# Patient Record
Sex: Female | Born: 1965
Health system: Southern US, Community
[De-identification: ages and names within clinical notes are randomized; demographics above are authoritative.]

## PROBLEM LIST (undated history)

## (undated) DIAGNOSIS — R87619 Unspecified abnormal cytological findings in specimens from cervix uteri: Secondary | ICD-10-CM

## (undated) DIAGNOSIS — R609 Edema, unspecified: Secondary | ICD-10-CM

## (undated) DIAGNOSIS — A609 Anogenital herpesviral infection, unspecified: Secondary | ICD-10-CM

## (undated) DIAGNOSIS — N390 Urinary tract infection, site not specified: Secondary | ICD-10-CM

## (undated) DIAGNOSIS — M5030 Other cervical disc degeneration, unspecified cervical region: Secondary | ICD-10-CM

## (undated) DIAGNOSIS — M5020 Other cervical disc displacement, unspecified cervical region: Secondary | ICD-10-CM

## (undated) DIAGNOSIS — E039 Hypothyroidism, unspecified: Secondary | ICD-10-CM

## (undated) DIAGNOSIS — N83299 Other ovarian cyst, unspecified side: Secondary | ICD-10-CM

## (undated) HISTORY — DX: Other ovarian cyst, unspecified side: N83.299

## (undated) HISTORY — DX: Hypothyroidism, unspecified: E03.9

## (undated) HISTORY — PX: COLPOSCOPY: SHX161

## (undated) HISTORY — DX: Anogenital herpesviral infection, unspecified: A60.9

## (undated) HISTORY — DX: Other cervical disc degeneration, unspecified cervical region: M50.30

## (undated) HISTORY — DX: Unspecified abnormal cytological findings in specimens from cervix uteri: R87.619

## (undated) HISTORY — DX: Urinary tract infection, site not specified: N39.0

## (undated) HISTORY — DX: Edema, unspecified: R60.9

## (undated) HISTORY — DX: Other cervical disc displacement, unspecified cervical region: M50.20

---

## 1995-11-23 HISTORY — PX: AUGMENTATION MAMMAPLASTY: SUR837

## 1999-04-14 ENCOUNTER — Other Ambulatory Visit: Admission: RE | Admit: 1999-04-14 | Discharge: 1999-04-14 | Payer: Self-pay | Admitting: *Deleted

## 2000-06-13 ENCOUNTER — Other Ambulatory Visit: Admission: RE | Admit: 2000-06-13 | Discharge: 2000-06-13 | Payer: Self-pay | Admitting: *Deleted

## 2002-03-02 ENCOUNTER — Encounter: Admission: RE | Admit: 2002-03-02 | Discharge: 2002-03-02 | Payer: Self-pay | Admitting: *Deleted

## 2002-03-02 ENCOUNTER — Encounter: Payer: Self-pay | Admitting: *Deleted

## 2002-09-28 ENCOUNTER — Other Ambulatory Visit: Admission: RE | Admit: 2002-09-28 | Discharge: 2002-09-28 | Payer: Self-pay | Admitting: Obstetrics and Gynecology

## 2002-11-22 HISTORY — PX: COLPOSCOPY W/ BIOPSY / CURETTAGE: SUR283

## 2003-05-09 ENCOUNTER — Other Ambulatory Visit: Admission: RE | Admit: 2003-05-09 | Discharge: 2003-05-09 | Payer: Self-pay | Admitting: Obstetrics and Gynecology

## 2003-11-04 ENCOUNTER — Other Ambulatory Visit: Admission: RE | Admit: 2003-11-04 | Discharge: 2003-11-04 | Payer: Self-pay | Admitting: Obstetrics and Gynecology

## 2004-08-25 ENCOUNTER — Other Ambulatory Visit: Admission: RE | Admit: 2004-08-25 | Discharge: 2004-08-25 | Payer: Self-pay | Admitting: *Deleted

## 2005-08-23 ENCOUNTER — Ambulatory Visit (HOSPITAL_BASED_OUTPATIENT_CLINIC_OR_DEPARTMENT_OTHER): Admission: RE | Admit: 2005-08-23 | Discharge: 2005-08-23 | Payer: Self-pay | Admitting: Obstetrics and Gynecology

## 2005-08-23 HISTORY — PX: TUBAL LIGATION: SHX77

## 2006-02-17 ENCOUNTER — Encounter: Admission: RE | Admit: 2006-02-17 | Discharge: 2006-03-29 | Payer: Self-pay | Admitting: Neurology

## 2006-02-18 ENCOUNTER — Other Ambulatory Visit: Admission: RE | Admit: 2006-02-18 | Discharge: 2006-02-18 | Payer: Self-pay | Admitting: Obstetrics and Gynecology

## 2006-05-23 ENCOUNTER — Encounter: Admission: RE | Admit: 2006-05-23 | Discharge: 2006-05-23 | Payer: Self-pay | Admitting: Obstetrics and Gynecology

## 2007-03-20 ENCOUNTER — Other Ambulatory Visit: Admission: RE | Admit: 2007-03-20 | Discharge: 2007-03-20 | Payer: Self-pay | Admitting: Obstetrics and Gynecology

## 2008-05-08 ENCOUNTER — Encounter: Admission: RE | Admit: 2008-05-08 | Discharge: 2008-05-08 | Payer: Self-pay | Admitting: Obstetrics and Gynecology

## 2008-06-10 ENCOUNTER — Other Ambulatory Visit: Admission: RE | Admit: 2008-06-10 | Discharge: 2008-06-10 | Payer: Self-pay | Admitting: Obstetrics and Gynecology

## 2008-07-23 ENCOUNTER — Ambulatory Visit (HOSPITAL_BASED_OUTPATIENT_CLINIC_OR_DEPARTMENT_OTHER): Admission: RE | Admit: 2008-07-23 | Discharge: 2008-07-23 | Payer: Self-pay | Admitting: Urology

## 2008-10-27 ENCOUNTER — Emergency Department (HOSPITAL_COMMUNITY): Admission: EM | Admit: 2008-10-27 | Discharge: 2008-10-27 | Payer: Self-pay | Admitting: Family Medicine

## 2008-11-22 DIAGNOSIS — R609 Edema, unspecified: Secondary | ICD-10-CM

## 2008-11-22 HISTORY — DX: Edema, unspecified: R60.9

## 2008-12-04 ENCOUNTER — Ambulatory Visit (HOSPITAL_COMMUNITY): Admission: RE | Admit: 2008-12-04 | Discharge: 2008-12-04 | Payer: Self-pay | Admitting: Cardiology

## 2011-03-17 DIAGNOSIS — N83299 Other ovarian cyst, unspecified side: Secondary | ICD-10-CM

## 2011-03-17 HISTORY — DX: Other ovarian cyst, unspecified side: N83.299

## 2011-04-06 NOTE — Op Note (Signed)
NAME:  Jessica Hartman, ZUNKER NO.:  [id number]   MEDICAL RECORD NO.:  [id number]          PATIENT TYPE:  AMB   LOCATION:  [facility]                         FACILITY:  [facility]   PHYSICIAN:  Jamison Neighbor, M.D.  DATE OF BIRTH:  14-Jun-1966   DATE OF PROCEDURE:  07/23/2008  DATE OF DISCHARGE:                               OPERATIVE REPORT   PREOPERATIVE DIAGNOSIS:  Interstitial cystitis.   POSTOPERATIVE DIAGNOSIS:  Interstitial cystitis.   PROCEDURE:  Cystoscopy, urethral calibration, hydrodistention of the  bladder, Marcaine and Pyridium installation, Marcaine and Kenalog  injection.   SURGEON:  Dr. Logan Bores.   ANESTHESIA:  General.   COMPLICATIONS:  None.   DRAINS:  None.   BRIEF HISTORY:  This 45 year old female has had problems with pelvic  pain of undetermined etiology.  The patient describes it as being in the  bladder and has a sensation of her bladder cracking apart.  She also  has a sensation of her bladder feeling scraped raw.  The patient does  have adenomyosis, and there has been some question whether this enlarged  uterus may be causing problems with her bladder.  She is now to undergo  diagnostic cystoscopy.  The patient understands the risks and benefits  of the procedure.  She does note there were other options given to her  including potassium testing or simple empiric therapy.  The patient is  now to undergo cystoscopy and hydrodistention.  She understands the  risks and benefits of procedure and gave full informed consent.   PROCEDURE:  After successful induction of general anesthesia, the  patient was placed in the dorsal position, prepped with Betadine and  draped in the usual sterile fashion.  Careful bimanual examination  revealed unremarkable vagina.  The patient really did not have a  significant large uterus even though she does have a history of  adenomyosis.  NuvaRing was noted to be in place.  There was no  cystocele, rectocele or enterocele,  and there was no vault prolapse of  any kind.  Urethra was palpably normal.  No signs of a diverticulum.  Urethra was calibrated to 32-French with female urethral sounds with no  signs of significant stenosis or stricture.  The cystoscope was  inserted.  The bladder was carefully inspected.  No tumors or stones  could be seen.  Both ureteral orifices were normal in configuration and  location.  The uterus could be identified pushing down on the bladder  but was not significantly enlarged, and I do not believe that is the  source of the patient's problem.  The bladder was free of any  irregularity of the mucosa prior to hydrodistention.  Hydrodistention  was performed under a pressure of 100 cm of water for 5 minutes.  When  the bladder was drained, modest glomerulations could be seen.  Bladder  capacity of 750 mL was slightly diminished compared to a normal bladder  capacity of approximately 1150 but better the average IC capacity of  575.  This was certainly felt to be consistent with possible mild to  moderate interstitial cystitis.  The  bladder was drained.  A mixture of  Marcaine and Pyridium was left in the bladder.  Marcaine and Kenalog  were injected periurethrally.  The patient tolerated the procedure well  and was taken to the recovery room in good condition.  She will be sent  home on Lorcet Plus, Utira-C and doxycycline and return to see me in  follow-up at  which point we will assess the results of the hydrodistention.  Depending on how the patient feels, we will consider the initiation of  bladder directed therapy for her symptoms.  much      Jamison Neighbor, M.D.  Electronically Signed     RJE/MEDQ  D:  07/23/2008  T:  07/23/2008  Job:  [id number]

## 2011-04-09 NOTE — Op Note (Signed)
NAME:  Jessica Hartman, Jessica Hartman               ACCOUNT NO.:  [id number]   MEDICAL RECORD NO.:  [id number]          PATIENT TYPE:  AMB   LOCATION:  [facility]                         [facility]ACILITY:  [facility]   PHYSICIAN:  Cynthia P. Romine, M.D.DATE OF BIRTH:  [birth year]   DATE OF PROCEDURE:  08/23/2005  DATE OF DISCHARGE:                                 OPERATIVE REPORT   PREOPERATIVE DIAGNOSIS:  Desire for attempt at permanent surgical  sterilization.   POSTOPERATIVE DIAGNOSIS:  Desire for attempt at permanent surgical  sterilization.   OPERATION PERFORMED:  Laparoscopic Fallope ring bilateral tubal  sterilization procedure.   SURGEON:  Cynthia P. Romine, M.D.   ANESTHESIA:  General endotracheal.   ESTIMATED BLOOD LOSS:  Minimal.   COMPLICATIONS:  None.   DESCRIPTION OF PROCEDURE:  The patient was taken to the operating room and  after the induction of adequate general endotracheal, was placed in dorsal  lithotomy position and prepped and draped in the usual fashion.  The bladder  was drained with red rubber catheter.  A Hulka uterine manipulator was  placed.  Attention was turned next to the abdomen.  A small subumbilical  incision was made and a Veress needle was inserted into the peritoneal  space.  Proper placement of the Veress needle was tested by noting a  negative aspirate and free flow of saline through the Veress needle again  with a negative aspirin and then by noting the response of a drop of saline  placed at the hub of the Veress needle to negative pressure as the abdominal  wall was elevated.  Pneunmoperitoneum was created with 2.1 L of CO2 using  the automatic insufflator.  Next, the disposable 10-11 mm trocar was then  inserted into the peritoneal space and there was proper placement noted with  a laparoscope.  A small suprapubic incision was made and the 8 mm trocar was  inserted under direct visualization.  The Fallope ring applier was placed.  The anatomy was inspected.  The  uterus was mobile and of a normal size,  shape and contour.  The tubes and ovaries also appeared normal. In the  posterior cul-de-sac, between the uterosacral ligaments, there was a  peritoneal window consistent with possible endometriosis although no other  evidence of endometriosis was visualized.  The anterior cul-de-sac was  normal.  Photographic documentation was taken of the posterior cul-de-sac  window and of the tubes and ovaries, anterior cul-de-sac.  The right  fallopian tube was identified and traced to its fimbriated end.  The  midisthmic portion was elevated and a Fallope ring was placed.  A good  knuckle of tube was noted to be contained with the ring and good blanching  of the tube was noted after placement of the ring.  No bleeding was  encountered.  The procedure was repeated on the patient's left, identifying  the tube and tracing it to its fimbriated end.  The midisthmic portion was  elevated and the Fallope ring was placed.  A good knuckle of tube was noted  to be contained within the ring.  There was good blanching  of the tube noted  following placement of the ring and no bleeding was encountered.  Photographic documentation was taken of placement of each ring.  The  instruments were removed from the abdomen.  The laparoscope was removed. The  pneumoperitoneum was allowed to escape.  The sleeves were  removed and incisions were closed subcuticularly with 3-0 Vicryl.  The  instruments were removed from the vagina and the procedure was terminated.  The patient tolerated the procedure well, went in satisfactory condition to  post anesthesia recovery.           ______________________________  Edwena Felty. Romine, M.D.     CPR/MEDQ  D:  08/23/2005  T:  08/23/2005  Job:  [id number]

## 2011-05-28 HISTORY — PX: INTRAUTERINE DEVICE INSERTION: SHX323

## 2012-03-24 ENCOUNTER — Other Ambulatory Visit: Payer: Self-pay | Admitting: Obstetrics and Gynecology

## 2012-03-24 DIAGNOSIS — Z1231 Encounter for screening mammogram for malignant neoplasm of breast: Secondary | ICD-10-CM

## 2012-04-24 ENCOUNTER — Ambulatory Visit
Admission: RE | Admit: 2012-04-24 | Discharge: 2012-04-24 | Disposition: A | Discharge status: Home / Self Care | Payer: 59 | Source: Ambulatory Visit | Attending: Obstetrics and Gynecology | Admitting: Obstetrics and Gynecology

## 2012-04-24 DIAGNOSIS — Z1231 Encounter for screening mammogram for malignant neoplasm of breast: Secondary | ICD-10-CM

## 2013-03-28 ENCOUNTER — Ambulatory Visit: Payer: Self-pay | Admitting: Obstetrics and Gynecology

## 2013-05-18 ENCOUNTER — Encounter: Payer: Self-pay | Admitting: *Deleted

## 2013-05-31 ENCOUNTER — Encounter: Payer: Self-pay | Admitting: Nurse Practitioner

## 2013-05-31 ENCOUNTER — Ambulatory Visit (INDEPENDENT_AMBULATORY_CARE_PROVIDER_SITE_OTHER): Payer: 59 | Admitting: Nurse Practitioner

## 2013-05-31 VITALS — BP 112/62 | HR 64 | Resp 12 | Ht 62.5 in | Wt 126.0 lb

## 2013-05-31 DIAGNOSIS — K6289 Other specified diseases of anus and rectum: Secondary | ICD-10-CM

## 2013-05-31 DIAGNOSIS — Z0000 Encounter for general adult medical examination without abnormal findings: Secondary | ICD-10-CM

## 2013-05-31 DIAGNOSIS — Z01419 Encounter for gynecological examination (general) (routine) without abnormal findings: Secondary | ICD-10-CM

## 2013-05-31 DIAGNOSIS — G8929 Other chronic pain: Secondary | ICD-10-CM

## 2013-05-31 MED ORDER — VALACYCLOVIR HCL 1 G PO TABS: 1000.0000 mg | ORAL_TABLET | ORAL | Status: DC | PRN

## 2013-05-31 NOTE — Progress Notes (Signed)
47 y.o. G3P3 Married Caucasian Fe here for annual exam. Since Mirena IUD 7/12 menses is light.  She feels well except for having some rectal 'spasm or pain that seems to be worse at night.  No correlation to recent BM and no history of IBS.  She feels the pain is deep into rectum.  During her labor she is unsure if she had a laceration into the rectal area. She would like to have further evaluation.  Her daughter Turkey, is pt. here and just had a baby about 4 moths ago.  Patient's last menstrual period was 04/05/2013.          Sexually active: yes  The current method of family planning is tubal ligation.    Exercising: yes  running and weights  Smoker:  no  Health Maintenance: Pap:  02/26/2010  negative MMG:  04/24/2012 normal Colonoscopy:  never TDaP:  2008 Labs: PCP maintains labs and urine.    reports that she has never smoked. She has never used smokeless tobacco. She reports that she does not drink alcohol or use illicit drugs.  History reviewed. No pertinent past medical history.  Past Surgical History  Procedure Laterality Date  . Augmentation mammaplasty      breast implants    Current Outpatient Prescriptions  Medication Sig Dispense Refill  . acetaminophen (TYLENOL) 100 MG/ML solution Take 10 mg/kg by mouth every 4 (four) hours as needed for fever.      . Ascorbic Acid (VITAMIN C) 100 MG tablet Take 100 mg by mouth daily.      . fish oil-omega-3 fatty acids 1000 MG capsule Take 2 g by mouth daily.      . hydrochlorothiazide (MICROZIDE) 12.5 MG capsule Take 12.5 mg by mouth daily.      Marland Kitchen LORazepam (ATIVAN) 1 MG tablet Take 1 mg by mouth every 8 (eight) hours.      . Multiple Vitamin (MULTIVITAMIN) tablet Take 1 tablet by mouth daily.      . rizatriptan (MAXALT) 10 MG tablet Take 10 mg by mouth as needed for migraine. May repeat in 2 hours if needed      . valACYclovir (VALTREX) 1000 MG tablet Take 1,000 mg by mouth as needed.       No current facility-administered  medications for this visit.    Family History  Problem Relation Age of Onset  . Hypertension Mother   . Hypertension Father   . Cancer Father     melanoma  . Diabetes Father   . Cancer Maternal Grandmother     ovarian cancer    ROS:  Pertinent items are noted in HPI.  Otherwise, a comprehensive ROS was negative.  Exam:   BP 112/62  Pulse 64  Resp 12  Ht 5' 2.5" (1.588 m)  Wt 126 lb (57.153 kg)  BMI 22.66 kg/m2  LMP 04/05/2013 Height: 5' 2.5" (158.8 cm)  Ht Readings from Last 3 Encounters:  05/31/13 5' 2.5" (1.588 m)    General appearance: alert, cooperative and appears stated age Head: Normocephalic, without obvious abnormality, atraumatic Neck: no adenopathy, supple, symmetrical, trachea midline and thyroid normal to inspection and palpation Lungs: clear to auscultation bilaterally Breasts: normal appearance, no masses or tenderness Heart: regular rate and rhythm Abdomen: soft, non-tender; no masses,  no organomegaly Extremities: extremities normal, atraumatic, no cyanosis or edema Skin: Skin color, texture, turgor normal. No rashes or lesions Lymph nodes: Cervical, supraclavicular, and axillary nodes normal. No abnormal inguinal nodes palpated Neurologic: Grossly normal  Pelvic: External genitalia:  no lesions              Urethra:  normal appearing urethra with no masses, tenderness or lesions              Bartholin's and Skene's: normal                 Vagina: normal appearing vagina with normal color and discharge, no lesions              Cervix: anteverted IUD string is seen              Pap taken: yes including HR HPV Bimanual Exam:  Uterus:  normal size, contour, position, consistency, mobility, non-tender              Adnexa: no mass, fullness, tenderness               Rectovaginal: Confirms               Anus:  normal sphincter tone, no lesions, some pain but no mass  A:  Well Woman with normal exam  S/P BTL 10/06  History of menorrhagia and current  Mirena IUD in place 05/28/11  History of abnormal pap secondary to Paraguard IUD and maybe hx of HPV in  the remote past  Rectal pain of ? Etiology  History of Ov cyst bilaterally  P:   Pap smear as per guidelines   Call with pap results  Does need refill on Maxalt at this time - less HA's on Mirena IUD  Refill valtrex for 1 year  Mammogram due now and pt will schedule  Will get GI referral to Dr. Loreta Ave  counseled on breast self exam, adequate intake of calcium and vitamin D,   diet and exercise return annually or prn  An After Visit Summary was printed and given to the patient.

## 2013-05-31 NOTE — Patient Instructions (Signed)

## 2013-06-01 ENCOUNTER — Encounter: Payer: Self-pay | Admitting: Nurse Practitioner

## 2013-06-02 NOTE — Progress Notes (Signed)
Encounter reviewed by Dr. Mohanad Carsten Silva.  

## 2013-06-04 LAB — IPS PAP TEST WITH HPV

## 2013-06-08 ENCOUNTER — Telehealth: Payer: Self-pay | Admitting: Orthopedic Surgery

## 2013-06-08 NOTE — Telephone Encounter (Signed)
Pt notified of appt with Dr. Loreta Ave 06-19-13 at 1:45. Address and phone number given.

## 2014-06-03 ENCOUNTER — Ambulatory Visit: Payer: 59 | Admitting: Nurse Practitioner

## 2014-07-22 ENCOUNTER — Encounter: Payer: Self-pay | Admitting: Nurse Practitioner

## 2014-07-22 ENCOUNTER — Ambulatory Visit (INDEPENDENT_AMBULATORY_CARE_PROVIDER_SITE_OTHER): Payer: 59 | Admitting: Nurse Practitioner

## 2014-07-22 VITALS — BP 110/72 | HR 52 | Ht 62.5 in | Wt 127.0 lb

## 2014-07-22 DIAGNOSIS — N809 Endometriosis, unspecified: Secondary | ICD-10-CM

## 2014-07-22 DIAGNOSIS — Z01419 Encounter for gynecological examination (general) (routine) without abnormal findings: Secondary | ICD-10-CM

## 2014-07-22 DIAGNOSIS — N8000 Endometriosis of the uterus, unspecified: Secondary | ICD-10-CM

## 2014-07-22 DIAGNOSIS — Z975 Presence of (intrauterine) contraceptive device: Secondary | ICD-10-CM | POA: Insufficient documentation

## 2014-07-22 DIAGNOSIS — N8003 Adenomyosis of the uterus: Secondary | ICD-10-CM | POA: Insufficient documentation

## 2014-07-22 MED ORDER — VALACYCLOVIR HCL 1 G PO TABS: 1000.0000 mg | ORAL_TABLET | ORAL | Status: DC | PRN

## 2014-07-22 NOTE — Progress Notes (Signed)
Patient ID: Jessica Hartman, female   DOB: Aug 20, 1966, 48 y.o.   MRN: [id number] 48 y.o. G3P3 Married Caucasian Fe here for annual exam.  usually has spotting since IUD. No spotting for a year.  Saw Dr. Loreta Ave for rectal spasm and was treated with hycosamine without help.  Some vaso symptoms That re worse at night but tolerable.  New grand daughter is now 77 months old.  Patient's last menstrual period was 05/22/2013.          Sexually active: yes  The current method of family planning is tubal ligation and IUD. 05/28/11 Exercising: yes running and weights about 5 times per week Smoker: no   Health Maintenance:  Pap: 05/31/13, WNL, neg HR HPV MMG: 04/24/2012, Bi-Rads 1: negative   Colonoscopy: never  TDaP: 2008  Labs: PCP maintains labs and urine.     reports that she has never smoked. She has never used smokeless tobacco. She reports that she does not drink alcohol or use illicit drugs.  Past Medical History  Diagnosis Date  . Idiopathic edema 2010    using prn meds  . UTI (lower urinary tract infection)     post coital  . Ovarian cyst, complex 03/17/11    right 26 x 21 x 25 mm, also uterine fibroid  . HSV (herpes simplex virus) anogenital infection     Past Surgical History  Procedure Laterality Date  . Tubal ligation  08/23/05    follopian tube ring  . Augmentation mammaplasty  1997    breast implants  . Colposcopy w/ biopsy / curettage  2004    secondary to paraguqrd IUD  . Intrauterine device insertion  05/28/11    Mirena IUD    Current Outpatient Prescriptions  Medication Sig Dispense Refill  . acetaminophen (TYLENOL) 100 MG/ML solution Take 10 mg/kg by mouth every 4 (four) hours as needed for fever.      . Ascorbic Acid (VITAMIN C) 100 MG tablet Take 100 mg by mouth daily.      . fish oil-omega-3 fatty acids 1000 MG capsule Take 2 g by mouth daily.      . hydrochlorothiazide (MICROZIDE) 12.5 MG capsule Take 12.5 mg by mouth daily.      Marland Kitchen levonorgestrel (MIRENA) 20 MCG/24HR IUD  1 each by Intrauterine route once.      Marland Kitchen LORazepam (ATIVAN) 1 MG tablet Take 1 mg by mouth every 8 (eight) hours.      . Multiple Vitamin (MULTIVITAMIN) tablet Take 1 tablet by mouth daily.      . nitrofurantoin, macrocrystal-monohydrate, (MACROBID) 100 MG capsule Take 100 mg by mouth as needed.      . rizatriptan (MAXALT) 10 MG tablet Take 10 mg by mouth as needed for migraine. May repeat in 2 hours if needed      . valACYclovir (VALTREX) 1000 MG tablet Take 1 tablet (1,000 mg total) by mouth as needed.  90 tablet  3   No current facility-administered medications for this visit.    Family History  Problem Relation Age of Onset  . Hypertension Father   . Cancer Father 74    melanoma  . Diabetes Father   . Hyperlipidemia Father   . Ovarian cancer Maternal Grandmother     ovarian cancer  . Stroke Paternal Grandmother     ROS:  Pertinent items are noted in HPI.  Otherwise, a comprehensive ROS was negative.  Exam:   BP 110/72  Pulse 52  Ht 5' 2.5" (1.588 m)  Wt 127 lb (57.607 kg)  BMI 22.84 kg/m2  LMP 05/22/2013 Height: 5' 2.5" (158.8 cm)  Ht Readings from Last 3 Encounters:  07/22/14 5' 2.5" (1.588 m)  05/31/13 5' 2.5" (1.588 m)    General appearance: alert, cooperative and appears stated age Head: Normocephalic, without obvious abnormality, atraumatic Neck: no adenopathy, supple, symmetrical, trachea midline and thyroid normal to inspection and palpation Lungs: clear to auscultation bilaterally Breasts: normal appearance, no masses or tenderness Heart: regular rate and rhythm Abdomen: soft, non-tender; no masses,  no organomegaly Extremities: extremities normal, atraumatic, no cyanosis or edema Skin: Skin color, texture, turgor normal. No rashes or lesions Lymph nodes: Cervical, supraclavicular, and axillary nodes normal. No abnormal inguinal nodes palpated Neurologic: Grossly normal   Pelvic: External genitalia:  no lesions              Urethra:  normal appearing  urethra with no masses, tenderness or lesions              Bartholin's and Skene's: normal                 Vagina: normal appearing vagina with normal color and discharge, no lesions              Cervix: anteverted              Pap taken: No. Bimanual Exam:  Uterus:  normal size, contour, position, consistency, mobility, non-tender              Adnexa: no mass, fullness, tenderness               Rectovaginal: Confirms               Anus:  normal sphincter tone, no lesions  A:  Well Woman with normal exam  IUD - Mirena 05/2011  History of rectal spasm  History of Adenomyosis  P:   Reviewed health and wellness pertinent to exam  Pap smear not taken today  Mammogram is due and she will schedule  Valtrex does not now and will refill for when she needs.  Counseled on breast self exam, mammography screening, adequate intake of calcium and vitamin D, diet and exercise, Kegel's exercises return annually or prn  An After Visit Summary was printed and given to the patient.

## 2014-07-22 NOTE — Progress Notes (Signed)
Encounter reviewed by Dr. Brook Silva.  

## 2014-07-22 NOTE — Patient Instructions (Signed)

## 2014-09-23 ENCOUNTER — Encounter: Payer: Self-pay | Admitting: Nurse Practitioner

## 2015-06-12 ENCOUNTER — Other Ambulatory Visit: Payer: Self-pay

## 2015-06-12 DIAGNOSIS — Z1231 Encounter for screening mammogram for malignant neoplasm of breast: Secondary | ICD-10-CM

## 2015-07-29 ENCOUNTER — Encounter: Payer: Self-pay | Admitting: Nurse Practitioner

## 2015-07-29 ENCOUNTER — Ambulatory Visit (INDEPENDENT_AMBULATORY_CARE_PROVIDER_SITE_OTHER): Payer: 59 | Admitting: Nurse Practitioner

## 2015-07-29 VITALS — BP 100/66 | HR 52 | Ht 62.0 in | Wt 126.0 lb

## 2015-07-29 DIAGNOSIS — Z01419 Encounter for gynecological examination (general) (routine) without abnormal findings: Secondary | ICD-10-CM

## 2015-07-29 MED ORDER — NITROFURANTOIN MONOHYD MACRO 100 MG PO CAPS: 100.0000 mg | ORAL_CAPSULE | ORAL | Status: DC | PRN

## 2015-07-29 MED ORDER — VALACYCLOVIR HCL 1 G PO TABS: 1000.0000 mg | ORAL_TABLET | ORAL | Status: DC | PRN

## 2015-07-29 MED ORDER — AZITHROMYCIN 250 MG PO TABS: 250.0000 mg | ORAL_TABLET | Freq: Every day | ORAL | Status: DC

## 2015-07-29 NOTE — Patient Instructions (Signed)

## 2015-07-29 NOTE — Progress Notes (Signed)
Patient ID: Jessica Hartman, female   DOB: 15-Nov-1966, 49 y.o.   MRN: [id number] 49 y.o. G34P3003 Married  Caucasian Fe here for annual exam.  Since Mirena IUD placement 05/2011 she has had usually only light spotting and this has been only on rare occasions. Then in May, menses was moderate for a week like her cycles before IUD placement .  Previous spotting before this was 05/2013.  She is having some left maxillary pain from sinusitis with thick nasal discharge.  She ask for antibiotic - to see PCP on 9/15 but not does want to postpone treatment.  Patient's last menstrual period was 04/21/2015 (exact date).          Sexually active: Yes.    The current method of family planning is tubal ligation IUD.  Mirena place 05/28/11.    Exercising: Yes.    weights and running 3-5 miles 4-5 times per week Smoker:  no  Health Maintenance: Pap: 05/31/13, Negative with neg HR HPV MMG: 04/24/2012, Bi-Rads 1: Negative, has apt 08/11/15  Colonoscopy: Never   TDaP: 2008  Labs:  Dr. Timothy Lasso 06/2015   reports that she has never smoked. She has never used smokeless tobacco. She reports that she does not drink alcohol or use illicit drugs.  Past Medical History  Diagnosis Date  . Idiopathic edema 2010    using prn meds  . UTI (lower urinary tract infection)     post coital  . Ovarian cyst, complex 03/17/11    right 26 x 21 x 25 mm, also uterine fibroid  . HSV (herpes simplex virus) anogenital infection     Past Surgical History  Procedure Laterality Date  . Tubal ligation  08/23/05    follopian tube ring  . Augmentation mammaplasty  1997    breast implants  . Colposcopy w/ biopsy / curettage  2004    secondary to paraguqrd IUD  . Intrauterine device insertion  05/28/11    Mirena IUD    Current Outpatient Prescriptions  Medication Sig Dispense Refill  . acetaminophen (TYLENOL) 100 MG/ML solution Take 10 mg/kg by mouth every 4 (four) hours as needed for fever.    . Ascorbic Acid (VITAMIN C) 100 MG tablet Take  100 mg by mouth daily.    . fish oil-omega-3 fatty acids 1000 MG capsule Take 2 g by mouth daily.    . hydrochlorothiazide (MICROZIDE) 12.5 MG capsule Take 12.5 mg by mouth daily.    Marland Kitchen levonorgestrel (MIRENA) 20 MCG/24HR IUD 1 each by Intrauterine route once.    Marland Kitchen LORazepam (ATIVAN) 1 MG tablet Take 1 mg by mouth every 8 (eight) hours.    . montelukast (SINGULAIR) 10 MG tablet Take 10 mg by mouth at bedtime.    . Multiple Vitamin (MULTIVITAMIN) tablet Take 1 tablet by mouth daily.    . nitrofurantoin, macrocrystal-monohydrate, (MACROBID) 100 MG capsule Take 1 capsule (100 mg total) by mouth as needed. 90 capsule 3  . rizatriptan (MAXALT) 10 MG tablet Take 10 mg by mouth as needed for migraine. May repeat in 2 hours if needed    . valACYclovir (VALTREX) 1000 MG tablet Take 1 tablet (1,000 mg total) by mouth as needed. 90 tablet 3  . azithromycin (ZITHROMAX) 250 MG tablet Take 1 tablet (250 mg total) by mouth daily. 6 tablet 0   No current facility-administered medications for this visit.    Family History  Problem Relation Age of Onset  . Hypertension Father   . Cancer Father 37  melanoma  . Diabetes Father   . Hyperlipidemia Father   . Ovarian cancer Maternal Grandmother     ovarian cancer  . Stroke Paternal Grandmother     ROS:  Pertinent items are noted in HPI.  Otherwise, a comprehensive ROS was negative.  Exam:   BP 100/66 mmHg  Pulse 52  Ht  (1.575 m)  Wt 126 lb (57.153 kg)  BMI 23.04 kg/m2  LMP 04/21/2015 (Exact Date) Height:  (157.5 cm) Ht Readings from Last 3 Encounters:  07/29/15  (1.575 m)  07/22/14 5' 2.5" (1.588 m)  05/31/13 5' 2.5" (1.588 m)    General appearance: alert, cooperative and appears stated age Head: Normocephalic, without obvious abnormality, atraumatic Neck: no adenopathy, supple, symmetrical, trachea midline and thyroid normal to inspection and palpation Lungs: clear to auscultation bilaterally Breasts: positive findings:  implants bilaterally, with adhesions and they are very firm. Heart: regular rate and rhythm Abdomen: soft, non-tender; no masses,  no organomegaly Extremities: extremities normal, atraumatic, no cyanosis or edema Skin: Skin color, texture, turgor normal. No rashes or lesions Lymph nodes: Cervical, supraclavicular, and axillary nodes normal. No abnormal inguinal nodes palpated Neurologic: Grossly normal   Pelvic: External genitalia:  no lesions              Urethra:  normal appearing urethra with no masses, tenderness or lesions              Bartholin's and Skene's: normal                 Vagina: normal appearing vagina with normal color and discharge, no lesions              Cervix: anteverted, IUD strings are visible              Pap taken: No. Bimanual Exam:  Uterus:  normal size, contour, position, consistency, mobility, non-tender              Adnexa: no mass, fullness, tenderness               Rectovaginal: Confirms               Anus:  normal sphincter tone, no lesions  Chaperone present: no  A:  Well Woman with normal exam  IUD - Mirena 05/2011 History of rectal spasm History of Adenomyosis  S/P Breast augmentation 1997  Maxillary sinusitis   P:   Reviewed health and wellness pertinent to exam  Pap smear as above  Mammogram is due and scheduled for 07/2015  Refill on Valtrex and Macrobid to use prn  Rx. Zithromax Z pack # 6 take as directed - if no better to see PCP - her apt. Is already scheduled for 08/07/15  Counseled on breast self exam, mammography screening, adequate intake of calcium and vitamin D, diet and exercise, Kegel's exercises return annually or prn  An After Visit Summary was printed and given to the patient.

## 2015-07-31 NOTE — Progress Notes (Signed)
Encounter reviewed by Dr. Brook Amundson C. Silva.  

## 2015-08-11 ENCOUNTER — Ambulatory Visit
Admission: RE | Admit: 2015-08-11 | Discharge: 2015-08-11 | Disposition: A | Discharge status: Home / Self Care | Payer: 59 | Source: Ambulatory Visit

## 2015-08-11 DIAGNOSIS — Z1231 Encounter for screening mammogram for malignant neoplasm of breast: Secondary | ICD-10-CM

## 2016-01-09 DIAGNOSIS — H5203 Hypermetropia, bilateral: Secondary | ICD-10-CM | POA: Diagnosis not present

## 2016-01-09 DIAGNOSIS — H52222 Regular astigmatism, left eye: Secondary | ICD-10-CM | POA: Diagnosis not present

## 2016-01-09 DIAGNOSIS — H524 Presbyopia: Secondary | ICD-10-CM | POA: Diagnosis not present

## 2016-01-21 DIAGNOSIS — L570 Actinic keratosis: Secondary | ICD-10-CM | POA: Diagnosis not present

## 2016-01-21 DIAGNOSIS — L739 Follicular disorder, unspecified: Secondary | ICD-10-CM | POA: Diagnosis not present

## 2016-02-25 DIAGNOSIS — L309 Dermatitis, unspecified: Secondary | ICD-10-CM | POA: Diagnosis not present

## 2016-07-07 MED FILL — LORazepam 0.5 MG TABS: 0.5 | 30 days supply | Qty: 30 | Fill #0

## 2016-07-07 MED FILL — valACYclovir HCL 1 GM TABS: 1 | 90 days supply | Qty: 90 | Fill #0

## 2016-07-07 MED FILL — NITROFURANTOIN MONO-MCR 100: 100 | 90 days supply | Qty: 90 | Fill #1

## 2016-07-07 MED FILL — HYDROCHLOROTHIAZIDE 12.5 MG: 12.5 | 30 days supply | Qty: 30 | Fill #0

## 2016-07-12 MED FILL — TRETINOIN 0.025% CREAM: 0.025 | 90 days supply | Qty: 45 | Fill #0

## 2016-08-02 ENCOUNTER — Ambulatory Visit (INDEPENDENT_AMBULATORY_CARE_PROVIDER_SITE_OTHER): Payer: 59 | Admitting: Nurse Practitioner

## 2016-08-02 ENCOUNTER — Encounter: Payer: Self-pay | Admitting: Nurse Practitioner

## 2016-08-02 VITALS — BP 102/56 | HR 56 | Ht 62.0 in | Wt 128.0 lb

## 2016-08-02 DIAGNOSIS — Z23 Encounter for immunization: Secondary | ICD-10-CM

## 2016-08-02 DIAGNOSIS — Z124 Encounter for screening for malignant neoplasm of cervix: Secondary | ICD-10-CM | POA: Diagnosis not present

## 2016-08-02 DIAGNOSIS — Z0000 Encounter for general adult medical examination without abnormal findings: Secondary | ICD-10-CM | POA: Diagnosis not present

## 2016-08-02 DIAGNOSIS — Z01419 Encounter for gynecological examination (general) (routine) without abnormal findings: Secondary | ICD-10-CM | POA: Diagnosis not present

## 2016-08-02 DIAGNOSIS — Z1151 Encounter for screening for human papillomavirus (HPV): Secondary | ICD-10-CM | POA: Diagnosis not present

## 2016-08-02 DIAGNOSIS — N951 Menopausal and female climacteric states: Secondary | ICD-10-CM | POA: Diagnosis not present

## 2016-08-02 LAB — HIV ANTIBODY (ROUTINE TESTING W REFLEX): HIV 1&2 Ab, 4th Generation: NONREACTIVE

## 2016-08-02 NOTE — Patient Instructions (Signed)

## 2016-08-02 NOTE — Progress Notes (Signed)
Patient ID: Jessica Hartman, female   DOB: 01/16/1966, 50 y.o.   MRN: [id number]  50 y.o. [id number] Married  Caucasian Fe here for annual exam.  Last spotting 03/2015 for a few days.  Now has some vaginal discharge over a couple months.  IUD needs replacement or removal.  Will get [facility] and follow.  Had a pt incident and got stuck with a needle last fall.  Needs follow up HIV  & Hep C.  Patient's last menstrual period was 04/21/2015 (exact date).          Sexually active: Yes.    The current method of family planning is tubal ligation and IUD. Mirena placed 05/28/11. Exercising: Yes.  walking, running and weights at least three times per week  Smoker:  no  Health Maintenance: Pap: 05/31/13, Negative with neg HR HPV MMG: 08/11/15, 3D, Bi-Rads 1: Negative  Colonoscopy: Spring 2017, normal, repeat in 10 years, Dr. Elnoria Howard TDaP: 2008 HIV: 1998 with pregnancy Labs: Dr. Timothy Lasso takes care of all labs   reports that she has never smoked. She has never used smokeless tobacco. She reports that she does not drink alcohol or use drugs.  Past Medical History:  Diagnosis Date  . HSV (herpes simplex virus) anogenital infection   . Idiopathic edema 2010   using prn meds  . Ovarian cyst, complex 03/17/11   right 26 x 21 x 25 mm, also uterine fibroid  . UTI (lower urinary tract infection)    post coital    Past Surgical History:  Procedure Laterality Date  . AUGMENTATION MAMMAPLASTY  1997   breast implants  . COLPOSCOPY W/ BIOPSY / CURETTAGE  2004   secondary to paraguqrd IUD  . INTRAUTERINE DEVICE INSERTION  05/28/11   Mirena IUD  . TUBAL LIGATION  08/23/05   follopian tube ring    Current Outpatient Prescriptions  Medication Sig Dispense Refill  . acetaminophen (TYLENOL) 100 MG/ML solution Take 10 mg/kg by mouth every 4 (four) hours as needed for fever.    . Ascorbic Acid (VITAMIN C) 100 MG tablet Take 100 mg by mouth daily.    . fish oil-omega-3 fatty acids 1000 MG capsule Take 2 g by mouth daily.    .  hydrochlorothiazide (MICROZIDE) 12.5 MG capsule Take 12.5 mg by mouth daily.    Marland Kitchen levonorgestrel (MIRENA) 20 MCG/24HR IUD 1 each by Intrauterine route once.    Marland Kitchen LORazepam (ATIVAN) 1 MG tablet Take 1 mg by mouth every 8 (eight) hours.    . montelukast (SINGULAIR) 10 MG tablet Take 10 mg by mouth at bedtime.    . Multiple Vitamin (MULTIVITAMIN) tablet Take 1 tablet by mouth daily.    . nitrofurantoin, macrocrystal-monohydrate, (MACROBID) 100 MG capsule Take 1 capsule (100 mg total) by mouth as needed. 90 capsule 3  . rizatriptan (MAXALT) 10 MG tablet Take 10 mg by mouth as needed for migraine. May repeat in 2 hours if needed    . valACYclovir (VALTREX) 1000 MG tablet Take 1 tablet (1,000 mg total) by mouth as needed. 90 tablet 3   No current facility-administered medications for this visit.     Family History  Problem Relation Age of Onset  . Hypertension Father   . Cancer Father 23    melanoma  . Diabetes Father   . Hyperlipidemia Father   . Ovarian cancer Maternal Grandmother     ovarian cancer  . Stroke Paternal Grandmother     ROS:  Pertinent items are noted in HPI.  Otherwise, a comprehensive ROS was negative.  Exam:   BP (!) 102/56 (BP Location: Right Arm, Patient Position: Sitting, Cuff Size: Normal)   Pulse (!) 56   Ht 5\' 2"  (1.575 m)   Wt 128 lb (58.1 kg)   LMP 04/21/2015 (Exact Date) Comment: spotting only, none since  BMI 23.41 kg/m  Height: 5\' 2"  (157.5 cm) Ht Readings from Last 3 Encounters:  08/02/16 5\' 2"  (1.575 m)  07/29/15 5\' 2"  (1.575 m)  07/22/14 5' 2.5" (1.588 m)    General appearance: alert, cooperative and appears stated age Head: Normocephalic, without obvious abnormality, atraumatic Neck: no adenopathy, supple, symmetrical, trachea midline and thyroid normal to inspection and palpation Lungs: clear to auscultation bilaterally Breasts: normal appearance, no masses or tenderness, positive findings: implants bilaterally that are very firm Heart:  regular rate and rhythm Abdomen: soft, non-tender; no masses,  no organomegaly Extremities: extremities normal, atraumatic, no cyanosis or edema Skin: Skin color, texture, turgor normal. No rashes or lesions Lymph nodes: Cervical, supraclavicular, and axillary nodes normal. No abnormal inguinal nodes palpated Neurologic: Grossly normal   Pelvic: External genitalia:  no lesions              Urethra:  normal appearing urethra with no masses, tenderness or lesions              Bartholin's and Skene's: normal                 Vagina: normal appearing vagina with normal color and discharge, no lesions              Cervix: anteverted IUD strings are visible              Pap taken: Yes.   Bimanual Exam:  Uterus:  normal size, contour, position, consistency, mobility, non-tender              Adnexa: no mass, fullness, tenderness               Rectovaginal: Confirms               Anus:  normal sphincter tone, no lesions  Chaperone present: yes  A:  Well Woman with normal exam   Mirena 05/28/2011 History of rectal spasm - saw Dr. Elnoria HowardHung History of Adenomyosis              S/P Breast augmentation 1997              History of Idiopathic edema  Remote history of abnormal pap 2004 secondary to Paraguard IUD                      P:   Reviewed health and wellness pertinent to exam  Pap smear as above  Mammogram is due this month  Follow with labs  [facility] if [facility] or if another needs insertion.  Counseled on breast self exam, mammography screening, adequate intake of calcium and vitamin D, diet and exercise, Kegel's exercises return annually or prn  An After Visit Summary was printed and given to the patient.

## 2016-08-03 ENCOUNTER — Other Ambulatory Visit: Payer: Self-pay | Admitting: Nurse Practitioner

## 2016-08-03 DIAGNOSIS — Z975 Presence of (intrauterine) contraceptive device: Secondary | ICD-10-CM

## 2016-08-03 LAB — HEPATITIS C ANTIBODY: HCV Ab: NEGATIVE

## 2016-08-03 LAB — FOLLICLE STIMULATING HORMONE: FSH: 53.6 m[IU]/mL

## 2016-08-04 ENCOUNTER — Other Ambulatory Visit: Payer: Self-pay | Admitting: *Deleted

## 2016-08-04 DIAGNOSIS — Z309 Encounter for contraceptive management, unspecified: Secondary | ICD-10-CM

## 2016-08-04 LAB — IPS PAP TEST WITH HPV

## 2016-08-07 NOTE — Progress Notes (Signed)
Encounter reviewed by Dr. Brook Amundson C. Silva.  

## 2016-09-06 ENCOUNTER — Ambulatory Visit (INDEPENDENT_AMBULATORY_CARE_PROVIDER_SITE_OTHER): Payer: 59 | Admitting: Obstetrics and Gynecology

## 2016-09-06 ENCOUNTER — Encounter: Payer: Self-pay | Admitting: Obstetrics and Gynecology

## 2016-09-06 VITALS — BP 118/78 | HR 58 | Resp 14 | Wt 125.0 lb

## 2016-09-06 DIAGNOSIS — Z30432 Encounter for removal of intrauterine contraceptive device: Secondary | ICD-10-CM | POA: Diagnosis not present

## 2016-09-06 DIAGNOSIS — Z308 Encounter for other contraceptive management: Secondary | ICD-10-CM

## 2016-09-06 NOTE — Progress Notes (Signed)
GYNECOLOGY  VISIT   HPI: 50 y.o.   Married  Caucasian  female   [phone] with No LMP recorded. Patient is not currently having periods (Reason: IUD).   here for IUD removal. Mirena IUD placed on 05/28/11.  FSH 53.6 on 08/02/16. Hot flashes resolved.  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   GYNECOLOGIC HISTORY: No LMP recorded. Patient is not currently having periods (Reason: IUD). Contraception:  IUD (Mirena) Menopausal hormone therapy:  None  Last mammogram:  08-13-15 WNL BI RADS 1 Last pap smear:   08-02-16 WNL NEG HR HPV         OB History    Gravida Para Term Preterm AB Living   3 3 1 2  0 3   SAB TAB Ectopic Multiple Live Births   0 0 0 0 3         Patient Active Problem List   Diagnosis Date Noted  . Adenomyosis 07/22/2014  . IUD (intrauterine device) in place 07/22/2014    Past Medical History:  Diagnosis Date  . HSV (herpes simplex virus) anogenital infection   . Idiopathic edema 2010   using prn meds  . Ovarian cyst, complex 03/17/11   right 26 x 21 x 25 mm, also uterine fibroid  . UTI (lower urinary tract infection)    post coital    Past Surgical History:  Procedure Laterality Date  . AUGMENTATION MAMMAPLASTY  1997   breast implants  . COLPOSCOPY W/ BIOPSY / CURETTAGE  2004   secondary to paraguqrd IUD  . INTRAUTERINE DEVICE INSERTION  05/28/11   Mirena IUD  . TUBAL LIGATION  08/23/05   follopian tube ring    Current Outpatient Prescriptions  Medication Sig Dispense Refill  . acetaminophen (TYLENOL) 100 MG/ML solution Take 10 mg/kg by mouth every 4 (four) hours as needed for fever.    . Ascorbic Acid (VITAMIN C) 100 MG tablet Take 100 mg by mouth daily.    . fish oil-omega-3 fatty acids 1000 MG capsule Take 2 g by mouth daily.    . hydrochlorothiazide (MICROZIDE) 12.5 MG capsule Take 12.5 mg by mouth daily.    Marland Kitchen. levonorgestrel (MIRENA) 20 MCG/24HR IUD 1 each by Intrauterine route once.    Marland Kitchen. LORazepam (ATIVAN) 1 MG tablet Take 1 mg by mouth every 8  (eight) hours.    . montelukast (SINGULAIR) 10 MG tablet Take 10 mg by mouth at bedtime.    . Multiple Vitamin (MULTIVITAMIN) tablet Take 1 tablet by mouth daily.    . nitrofurantoin, macrocrystal-monohydrate, (MACROBID) 100 MG capsule Take 1 capsule (100 mg total) by mouth as needed. 90 capsule 3  . rizatriptan (MAXALT) 10 MG tablet Take 10 mg by mouth as needed for migraine. May repeat in 2 hours if needed    . valACYclovir (VALTREX) 1000 MG tablet Take 1 tablet (1,000 mg total) by mouth as needed. 90 tablet 3   No current facility-administered medications for this visit.      ALLERGIES: Clindamycin/lincomycin and Lidocaine  Family History  Problem Relation Age of Onset  . Hypertension Father   . Cancer Father [id number]    melanoma  . Diabetes Father   . Hyperlipidemia Father   . Heart disease Father   . Dementia Father   . Ovarian cancer Maternal Grandmother 35    OV cancer  . Stroke Paternal Grandmother     Social History   Social History  . Marital status: Married    Spouse name: N/A  .  Number of children: N/A  . Years of education: N/A   Occupational History  . Not on file.   Social History Main Topics  . Smoking status: Never Smoker  . Smokeless tobacco: Never Used  . Alcohol use No  . Drug use: No  . Sexual activity: Yes    Birth control/ protection: Surgical     Comment: BTL   Other Topics Concern  . Not on file   Social History Narrative  . No narrative on file    ROS:  Pertinent items are noted in HPI.  PHYSICAL EXAMINATION:    BP 118/78 (BP Location: Right Arm, Patient Position: Sitting, Cuff Size: Normal)   Pulse (!) 58   Resp 14   Wt 125 lb (56.7 kg)   BMI 22.86 kg/m     General appearance: alert, cooperative and appears stated age.  Looks tired.    Pelvic: External genitalia:  no lesions              Urethra:  normal appearing urethra with no masses, tenderness or lesions              Bartholins and Skenes: normal                 Vagina:  normal appearing vagina with normal color and discharge, no lesions              Cervix: no lesions.  IUD strings noted.  IUD removed with ring forceps after verbal consent.  IUD intact, shown to patient, and discarded.                Bimanual Exam:  Uterus:  normal size, contour, position, consistency, mobility, non-tender              Adnexa: no mass, fullness, tenderness           Chaperone was present for exam.  ASSESSMENT  Mirena IUD removal.  Menopausal by [facility].   PLAN  Call for any future bleeding.  Call for hot flashes, night sweats, and vaginal dryness. Follow up for annual exam and prn.    An After Visit Summary was printed and given to the patient.  ___15___ minutes face to face time of which over 50% was spent in counseling.

## 2017-06-07 ENCOUNTER — Telehealth: Payer: Self-pay | Admitting: Certified Nurse Midwife

## 2017-06-07 NOTE — Telephone Encounter (Signed)
Left message for patient to reschedule with another provider

## 2017-07-30 ENCOUNTER — Emergency Department (HOSPITAL_COMMUNITY)
Admission: EM | Admit: 2017-07-30 | Discharge: 2017-07-30 | Disposition: A | Discharge status: Home / Self Care | Payer: PRIVATE HEALTH INSURANCE | Attending: Emergency Medicine | Admitting: Emergency Medicine

## 2017-07-30 ENCOUNTER — Encounter (HOSPITAL_COMMUNITY): Payer: Self-pay | Admitting: *Deleted

## 2017-07-30 ENCOUNTER — Emergency Department (HOSPITAL_COMMUNITY): Payer: PRIVATE HEALTH INSURANCE

## 2017-07-30 DIAGNOSIS — S6992XA Unspecified injury of left wrist, hand and finger(s), initial encounter: Secondary | ICD-10-CM | POA: Insufficient documentation

## 2017-07-30 DIAGNOSIS — Y939 Activity, unspecified: Secondary | ICD-10-CM | POA: Diagnosis not present

## 2017-07-30 DIAGNOSIS — Y929 Unspecified place or not applicable: Secondary | ICD-10-CM | POA: Diagnosis not present

## 2017-07-30 DIAGNOSIS — Y998 Other external cause status: Secondary | ICD-10-CM | POA: Insufficient documentation

## 2017-07-30 DIAGNOSIS — W230XXA Caught, crushed, jammed, or pinched between moving objects, initial encounter: Secondary | ICD-10-CM | POA: Diagnosis not present

## 2017-07-30 DIAGNOSIS — M79642 Pain in left hand: Secondary | ICD-10-CM | POA: Diagnosis present

## 2017-07-30 MED ORDER — ACETAMINOPHEN 325 MG PO TABS
650.0000 mg | ORAL_TABLET | Freq: Once | ORAL | Status: AC
  Administered 2017-07-30: 650 mg via ORAL
  Filled 2017-07-30: qty 2

## 2017-07-30 NOTE — ED Provider Notes (Signed)
[facility]-EMERGENCY DEPT Provider Note   CSN: [id number] Arrival date & time: 07/30/17  1850     History   Chief Complaint Chief Complaint  Patient presents with  . Hand Problem    HPI Jessica Hartman is a 51 y.o. female with no significant past medical history presenting with left hand pain after getting her hand stuck between 2 hospital beds. 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 She is reporting left thumb pain exacerbated by movement or palpation. No swelling or loss of sensation. Patient has not tried anything for pain prior to arrival.  HPI  Past Medical History:  Diagnosis Date  . HSV (herpes simplex virus) anogenital infection   . Idiopathic edema 2010   using prn meds  . Ovarian cyst, complex 03/17/11   right 26 x 21 x 25 mm, also uterine fibroid  . UTI (lower urinary tract infection)    post coital    Patient Active Problem List   Diagnosis Date Noted  . Adenomyosis 07/22/2014  . IUD (intrauterine device) in place 07/22/2014    Past Surgical History:  Procedure Laterality Date  . AUGMENTATION MAMMAPLASTY  1997   breast implants  . COLPOSCOPY W/ BIOPSY / CURETTAGE  2004   secondary to paraguqrd IUD  . INTRAUTERINE DEVICE INSERTION  05/28/11   Mirena IUD  . TUBAL LIGATION  08/23/05   follopian tube ring    OB History    Gravida Para Term Preterm AB Living   0 3   SAB TAB Ectopic Multiple Live Births   0 0 0 0 3       Home Medications    Prior to Admission medications   Medication Sig Start Date End Date Taking? Authorizing Provider  acetaminophen (TYLENOL) 100 MG/ML solution Take 10 mg/kg by mouth every 4 (four) hours as needed for fever.    [provider]  Ascorbic Acid (VITAMIN C) 100 MG tablet Take 100 mg by mouth daily.    [provider]  fish oil-omega-3 fatty acids 1000 MG capsule Take 2 g by mouth daily.    [provider]   hydrochlorothiazide (MICROZIDE) 12.5 MG capsule Take 12.5 mg by mouth daily.    [provider]  levonorgestrel (MIRENA) 20 MCG/24HR IUD 1 each by Intrauterine route once.    [provider]  LORazepam (ATIVAN) 1 MG tablet Take 1 mg by mouth every 8 (eight) hours.    [provider]  montelukast (SINGULAIR) 10 MG tablet Take 10 mg by mouth at bedtime.    [provider]  Multiple Vitamin (MULTIVITAMIN) tablet Take 1 tablet by mouth daily.    [provider]  nitrofurantoin, macrocrystal-monohydrate, (MACROBID) 100 MG capsule Take 1 capsule (100 mg total) by mouth as needed. 07/29/15   Ria Comment, FNP  rizatriptan (MAXALT) 10 MG tablet Take 10 mg by mouth as needed for migraine. May repeat in 2 hours if needed    [provider]  valACYclovir (VALTREX) 1000 MG tablet Take 1 tablet (1,000 mg total) by mouth as needed. 07/29/15   Ria Comment, FNP    Family History Family History  Problem Relation Age of Onset  . Hypertension Father   . Cancer Father 62       melanoma  . Diabetes [id number] Father   . Heart disease Father   .  Dementia Father   . Ovarian cancer Maternal Grandmother 35       OV cancer  . Stroke Paternal Grandmother     Social History Social History  Substance Use Topics  . Smoking status: Never Smoker  . Smokeless tobacco: Never Used  . Alcohol use Yes     Allergies   Clindamycin/lincomycin and Lidocaine   Review of Systems Review of Systems  Musculoskeletal: Positive for arthralgias and myalgias. Negative for joint swelling.  Skin: Negative for color change, pallor, rash and wound.  Neurological: Negative for weakness and numbness.     Physical Exam Updated Vital Signs BP 109/64 (BP Location: Right Arm)   Pulse (!) 59   Temp 98.5 F (36.9 C) (Oral)   Resp 18   Ht  (1.575 m)   Wt 59 kg (130 lb)   SpO2 100%   BMI 23.78 kg/m   Physical Exam  Constitutional: She appears  well-developed and well-nourished. No distress.  Afebrile, nontoxic-appearing, sitting comfortably in chair in no acute distress.  HENT:  Head: Normocephalic and atraumatic.  Eyes: Conjunctivae are normal.  Neck: Neck supple.  Cardiovascular: Normal rate.   No murmur heard. Pulmonary/Chest: Effort normal. No respiratory distress.  Abdominal: Soft. There is no tenderness.  Musculoskeletal: Normal range of motion. She exhibits tenderness. She exhibits no edema or deformity.  Patient with full range of motion of the thumb but with discomfort. No obvious edema or color change. Tender to palpation over the first metacarpal and PIP. No snuffbox tenderness  Neurological: She is alert. No sensory deficit. She exhibits normal muscle tone.  5 out of 5 strength in flexion and extension at MCP and PIP  Skin: Skin is warm and dry. Capillary refill takes less than 2 seconds. No rash noted. She is not diaphoretic. No erythema. No pallor.  Psychiatric: She has a normal mood and affect.  Nursing note and vitals reviewed.    ED Treatments / Results  Labs (all labs ordered are listed, but only abnormal results are displayed) Labs Reviewed - No data to display  EKG  EKG Interpretation None       Radiology Dg Hand Complete Left  Result Date: 07/30/2017 CLINICAL DATA:  Smashed LEFT hand between 2 beds, lateral LEFT hand pain, tender to palpation at first MCP joint EXAM: LEFT HAND - COMPLETE 3+ VIEW COMPARISON:  None FINDINGS: Osseous mineralization normal. Joint spaces preserved. Mild degenerative changes first CMC joint. No acute fracture, dislocation, or bone destruction. IMPRESSION: No acute osseous abnormalities. Electronically Signed   By: Ulyses Southward M.D.   On: 07/30/2017 20:57    Procedures Procedures (including critical care time) SPLINT APPLICATION Date/Time: 2:32 AM Authorized by: Georgiana Shore Consent: Verbal consent obtained. Risks and benefits: risks, benefits and alternatives  were discussed Consent given by: patient Splint applied by: orthopedic technician Location details: left hand Splint type: thumb spica Supplies used: velcro thumb spica Post-procedure: The splinted body part was neurovascularly unchanged following the procedure. Patient tolerance: Patient tolerated the procedure well with no immediate complications.   Medications Ordered in ED Medications  acetaminophen (TYLENOL) tablet 650 mg (650 mg Oral Given 07/30/17 2040)     Initial Impression / Assessment and Plan / ED Course  I have reviewed the triage vital signs and the nursing notes.  Pertinent labs & imaging results that were available during my care of the patient were reviewed by me and considered in my medical decision making (see chart for details).  Patient right-handed and presenting with left hand injury.  X-ray negative for acute fracture or dislocation.  Patient reported significant improvement after splinting and tylenol.  Discharge home with close follow up with PCP in a week for repeat imaging as needed if symptoms persist.  Rice protocol indicated and discussed. Patient was well-appearing and pain managed prior to discharge.  Discussed strict return precautions and advised to return to the emergency department if experiencing any new or worsening symptoms. Instructions were understood and patient agreed with discharge plan.  Final Clinical Impressions(s) / ED Diagnoses   Final diagnoses:  Injury of left hand, initial encounter    New Prescriptions Discharge Medication List as of 07/30/2017 10:34 PM       Georgiana ShoreMitchell, Barnet Benavides B, PA-C 07/31/17 0234    Mesner, Barbara CowerJason, MD 08/01/17 [phone]

## 2017-07-30 NOTE — ED Notes (Signed)
Ortho tech at bedside 

## 2017-07-30 NOTE — ED Notes (Signed)
Patient transported to X-ray 

## 2017-07-30 NOTE — Discharge Instructions (Signed)
As discussed, please follow the Rice protocol provided in these instructions and wear your splint for comfort. Follow-up with your primary care provider in a week.  Return if you experience any worsening, increased swelling, redness, warmth, worsening pain, numbness or any other new concerning symptoms in the meantime.

## 2017-07-30 NOTE — ED Triage Notes (Signed)
The pt is c/o pain in her lt hand after she caught it between two stretcher today while working.  Painful  lmp none

## 2017-07-30 NOTE — Progress Notes (Signed)
Orthopedic Tech Progress Note Patient Details:  Jessica Hartman 08/02/1966 [id number]  Ortho Devices Type of Ortho Device: Thumb velcro splint Ortho Device/Splint Location: lue Ortho Device/Splint Interventions: Ordered, Application   [name], [name] 07/30/2017, 9:43 PM

## 2017-08-03 ENCOUNTER — Ambulatory Visit: Payer: 59 | Admitting: Nurse Practitioner

## 2017-08-03 ENCOUNTER — Ambulatory Visit: Payer: 59 | Admitting: Certified Nurse Midwife

## 2017-08-16 ENCOUNTER — Ambulatory Visit: Payer: Self-pay

## 2017-08-16 ENCOUNTER — Other Ambulatory Visit: Payer: Self-pay | Admitting: Occupational Medicine

## 2017-08-16 DIAGNOSIS — M79645 Pain in left finger(s): Secondary | ICD-10-CM

## 2017-09-09 ENCOUNTER — Ambulatory Visit: Payer: 59 | Admitting: Certified Nurse Midwife

## 2017-10-06 ENCOUNTER — Ambulatory Visit: Payer: 59 | Admitting: Certified Nurse Midwife

## 2017-10-06 ENCOUNTER — Encounter: Payer: Self-pay | Admitting: Certified Nurse Midwife

## 2017-10-06 ENCOUNTER — Other Ambulatory Visit: Payer: Self-pay

## 2017-10-06 VITALS — BP 118/72 | HR 68 | Resp 16 | Ht 62.0 in | Wt 133.0 lb

## 2017-10-06 DIAGNOSIS — N951 Menopausal and female climacteric states: Secondary | ICD-10-CM | POA: Diagnosis not present

## 2017-10-06 DIAGNOSIS — N390 Urinary tract infection, site not specified: Secondary | ICD-10-CM

## 2017-10-06 DIAGNOSIS — Z8619 Personal history of other infectious and parasitic diseases: Secondary | ICD-10-CM | POA: Diagnosis not present

## 2017-10-06 DIAGNOSIS — Z01419 Encounter for gynecological examination (general) (routine) without abnormal findings: Secondary | ICD-10-CM

## 2017-10-06 MED ORDER — VALACYCLOVIR HCL 1 G PO TABS: ORAL_TABLET | ORAL | 3 refills | Status: DC

## 2017-10-06 MED ORDER — NITROFURANTOIN MONOHYD MACRO 100 MG PO CAPS: ORAL_CAPSULE | ORAL | 3 refills | Status: DC

## 2017-10-06 MED FILL — NITROFURANTOIN MONO-MCR 100: 100 | 90 days supply | Qty: 90 | Fill #0

## 2017-10-06 MED FILL — valACYclovir HCL 1 GM TABS: 1 | 90 days supply | Qty: 90 | Fill #0 | Status: TO

## 2017-10-06 NOTE — Progress Notes (Signed)
51 y.o. [id number] Married  Caucasian Fe here for annual exam. Menopausal no vaginal bleeding.  Some vaginal dryness uses OTC products.  Sees Dr. Timothy Lassousso for aex and labs and fluid retention with HCTZ. Has had a few HSV 2 outbreaks in the past year. Uses Valtrex with good results. Currently has an outbreak on her buttock area, which is resolving. Continues Macrobid use for postcoital uti, working well. Needs form completed for trip on [employer] ship with son who is serving in the [employer]. No health issues today.  Patient's last menstrual period was 04/21/2015 (exact date).          Sexually active: Yes.    The current method of family planning is tubal ligation.    Exercising: Yes.    run weights Smoker:  no  Health Maintenance: Pap:  05-31-13 neg HPV HR neg, 08-02-16 neg HPV HR neg History of Abnormal Pap: yes MMG:  08-10-16 category c density birads 1:neg Self Breast exams: yes Colonoscopy:  2016  Normal repeat 10 years BMD:   none TDaP:  2017 Shingles: no Pneumonia: no Hep C and HIV: both neg 2017 Labs: with PCP   reports that  has never smoked. she has never used smokeless tobacco. She reports that she drinks about 1.2 oz of alcohol per week. She reports that she does not use drugs.  Past Medical History:  Diagnosis Date  . HSV (herpes simplex virus) anogenital infection   . Idiopathic edema 2010   using prn meds  . Ovarian cyst, complex 03/17/11   right 26 x 21 x 25 mm, also uterine fibroid  . UTI (lower urinary tract infection)    post coital    Past Surgical History:  Procedure Laterality Date  . AUGMENTATION MAMMAPLASTY  1997   breast implants  . COLPOSCOPY W/ BIOPSY / CURETTAGE  2004   secondary to paraguqrd IUD  . INTRAUTERINE DEVICE INSERTION  05/28/11   Mirena IUD  . TUBAL LIGATION  08/23/05   follopian tube ring    Current Outpatient Medications  Medication Sig Dispense Refill  . acetaminophen (TYLENOL) 100 MG/ML solution Take 10 mg/kg by mouth every 4 (four) hours as needed  for fever.    . cetirizine (ZYRTEC) 10 MG tablet Take 10 mg daily by mouth.    . hydrochlorothiazide (MICROZIDE) 12.5 MG capsule Take 12.5 mg by mouth daily.    Marland Kitchen. LORazepam (ATIVAN) 1 MG tablet Take 1 mg as needed by mouth.     . Multiple Vitamin (MULTIVITAMIN) tablet Take 1 tablet by mouth daily.    . nitrofurantoin, macrocrystal-monohydrate, (MACROBID) 100 MG capsule Take 1 capsule (100 mg total) by mouth as needed. 90 capsule 3  . rizatriptan (MAXALT) 10 MG tablet Take 10 mg by mouth as needed for migraine. May repeat in 2 hours if needed    . valACYclovir (VALTREX) 1000 MG tablet Take 1 tablet (1,000 mg total) by mouth as needed. 90 tablet 3   No current facility-administered medications for this visit.     Family History  Problem Relation Age of Onset  . Hypertension Father   . Cancer Father [id number]       melanoma  . Diabetes Father   . Hyperlipidemia Father   . Heart disease Father   . Dementia Father   . Ovarian cancer Maternal Grandmother 35       OV cancer  . Stroke Paternal Grandmother     ROS:  Pertinent items are noted in HPI.  Otherwise, a comprehensive  ROS was negative.  Exam:   BP 118/72   Pulse 68   Resp 16   Ht 5\' 2"  (1.575 m)   Wt 133 lb (60.3 kg)   LMP 04/21/2015 (Exact Date) Comment: spotting only, none since  BMI 24.33 kg/m  Height: 5\' 2"  (157.5 cm) Ht Readings from Last 3 Encounters:  10/06/17 5\' 2"  (1.575 m)  07/30/17 5\' 2"  (1.575 m)  08/02/16 5\' 2"  (1.575 m)    General appearance: alert, cooperative and appears stated age Head: Normocephalic, without obvious abnormality, atraumatic Neck: no adenopathy, supple, symmetrical, trachea midline and thyroid normal to inspection and palpation Lungs: clear to auscultation bilaterally Breasts: normal appearance, no masses or tenderness, No nipple retraction or dimpling, No nipple discharge or bleeding, No axillary or supraclavicular adenopathy, implants saline palpate intact Heart: regular rate and  rhythm Abdomen: soft, non-tender; no masses,  no organomegaly Extremities: extremities normal, atraumatic, no cyanosis or edema Skin: Skin color, texture, turgor normal. No rashes or lesions Lymph nodes: Cervical, supraclavicular, and axillary nodes normal. No abnormal inguinal nodes palpated Neurologic: Grossly normal   Pelvic: External genitalia:  no lesions              Urethra:  normal appearing urethra with no masses, tenderness or lesions              Bartholin's and Skene's: normal                 Vagina: normal appearing vagina with normal color and discharge, no lesions              Cervix: multiparous appearance, no cervical motion tenderness and no lesions              Pap taken: No. Bimanual Exam:  Uterus:  normal size, contour, position, consistency, mobility, non-tender              Adnexa: normal adnexa and no mass, fullness, tenderness               Rectovaginal: Confirms               Anus:  normal sphincter tone, no lesions  Chaperone present: yes  A:  Well Woman with normal exam  Menopausal no HRT  Vaginal dryness uses OTC product with good response  Postcoital UTI Macrobid works well desires continuance  HSV 2 history with Valtrex use needs Rx update  PCP management of headaches and labs  P:   Reviewed health and wellness pertinent to exam  Discussed importance of advising if vaginal bleeding.  Discussed coconut oil option also.  Rx Macrobid see order with instructions prn use  Rx Valtrex see order with instructions for prn use  Continue follow up with PCP as indicated  Pap smear: no   counseled on breast self exam, mammography screening, adequate intake of calcium and vitamin D, diet and exercise  return annually or prn  An After Visit Summary was printed and given to the patient.

## 2017-10-06 NOTE — Patient Instructions (Signed)

## 2017-10-21 DIAGNOSIS — Z0000 Encounter for general adult medical examination without abnormal findings: Secondary | ICD-10-CM | POA: Diagnosis not present

## 2017-10-28 DIAGNOSIS — G4709 Other insomnia: Secondary | ICD-10-CM | POA: Diagnosis not present

## 2017-10-28 DIAGNOSIS — R946 Abnormal results of thyroid function studies: Secondary | ICD-10-CM | POA: Diagnosis not present

## 2017-10-28 DIAGNOSIS — B001 Herpesviral vesicular dermatitis: Secondary | ICD-10-CM | POA: Diagnosis not present

## 2017-10-28 DIAGNOSIS — K5909 Other constipation: Secondary | ICD-10-CM | POA: Diagnosis not present

## 2017-10-28 DIAGNOSIS — Z0000 Encounter for general adult medical examination without abnormal findings: Secondary | ICD-10-CM | POA: Diagnosis not present

## 2017-10-28 DIAGNOSIS — Z9882 Breast implant status: Secondary | ICD-10-CM | POA: Diagnosis not present

## 2017-10-28 DIAGNOSIS — I7300 Raynaud's syndrome without gangrene: Secondary | ICD-10-CM | POA: Diagnosis not present

## 2017-10-28 DIAGNOSIS — Z1389 Encounter for screening for other disorder: Secondary | ICD-10-CM | POA: Diagnosis not present

## 2017-10-28 DIAGNOSIS — G43909 Migraine, unspecified, not intractable, without status migrainosus: Secondary | ICD-10-CM | POA: Diagnosis not present

## 2017-10-28 DIAGNOSIS — N390 Urinary tract infection, site not specified: Secondary | ICD-10-CM | POA: Diagnosis not present

## 2017-10-28 MED FILL — BUTALB-ACETAMIN-CAFF 50-300: 50-300-40 | 14 days supply | Qty: 14 | Fill #0

## 2017-10-28 MED FILL — LORazepam 0.5 MG TABS: 0.5 | 30 days supply | Qty: 30 | Fill #0

## 2018-03-03 MED FILL — valACYclovir HCL 1 GM TABS: 1 | 90 days supply | Qty: 90 | Fill #0

## 2018-03-03 MED FILL — SHIPPING COST: 1 days supply | Qty: 1 | Fill #0

## 2018-04-20 MED FILL — NITROFURANTOIN MONO-MCR 100: 100 | 90 days supply | Qty: 90 | Fill #1

## 2018-04-20 MED FILL — HYDROCHLOROTHIAZIDE 12.5 MG: 12.5 | 30 days supply | Qty: 30 | Fill #0

## 2018-04-20 MED FILL — TRETINOIN 0.025% CREAM: 0.025 | 30 days supply | Qty: 45 | Fill #0

## 2018-07-19 MED FILL — SHIPPING COST: 1 days supply | Qty: 1 | Fill #1

## 2018-07-19 MED FILL — valACYclovir HCL 1 GM TABS: 1 | 90 days supply | Qty: 90 | Fill #1

## 2018-09-26 MED FILL — ERYTHROMYCIN EYE OINTMENT: 5 | 5 days supply | Qty: 4 | Fill #0

## 2018-10-10 ENCOUNTER — Ambulatory Visit: Payer: Self-pay | Admitting: Certified Nurse Midwife

## 2018-10-31 ENCOUNTER — Encounter: Payer: Self-pay | Admitting: Certified Nurse Midwife

## 2018-10-31 ENCOUNTER — Other Ambulatory Visit: Payer: Self-pay

## 2018-10-31 ENCOUNTER — Ambulatory Visit (INDEPENDENT_AMBULATORY_CARE_PROVIDER_SITE_OTHER): Payer: No Typology Code available for payment source | Admitting: Certified Nurse Midwife

## 2018-10-31 VITALS — BP 114/62 | HR 68 | Resp 16 | Ht 62.0 in | Wt 126.0 lb

## 2018-10-31 DIAGNOSIS — Z8669 Personal history of other diseases of the nervous system and sense organs: Secondary | ICD-10-CM | POA: Diagnosis not present

## 2018-10-31 DIAGNOSIS — N951 Menopausal and female climacteric states: Secondary | ICD-10-CM | POA: Diagnosis not present

## 2018-10-31 DIAGNOSIS — Z01419 Encounter for gynecological examination (general) (routine) without abnormal findings: Secondary | ICD-10-CM | POA: Diagnosis not present

## 2018-10-31 DIAGNOSIS — Z8619 Personal history of other infectious and parasitic diseases: Secondary | ICD-10-CM | POA: Diagnosis not present

## 2018-10-31 MED ORDER — VALACYCLOVIR HCL 1 G PO TABS: ORAL_TABLET | ORAL | 3 refills | Status: DC

## 2018-10-31 NOTE — Patient Instructions (Signed)

## 2018-10-31 NOTE — Progress Notes (Signed)
52 y.o. Married  Caucasian Fe here for annual exam. Menopausal no vaginal bleeding or vaginal dryness. Coconut oil working well for vaginal dryness. Occasional HSV on left buttock area at times. Need Rx update. Sees Dr. Timothy Lasso yearly for hypertension, migraine headaches. labs and aex.  Headaches having been occurring differently and following up with MD regarding. ? Allergies or sinus changes. No more post coital UTI if uses lubricant or moisturizer for dryness. No other health issues today. Son recent married in [place]!  Patient's last menstrual period was 04/21/2015 (exact date).          Sexually active: Yes.    The current method of family planning is tubal ligation.    Exercising: Yes.    walking & running weights Smoker:  no  Review of Systems  Constitutional: Negative.        Headache  HENT: Negative.   Eyes: Negative.   Respiratory: Negative.   Cardiovascular: Negative.   Gastrointestinal: Negative.   Genitourinary: Negative.   Musculoskeletal: Negative.   Skin: Negative.   Neurological: Negative.   Endo/Heme/Allergies: Negative.   Psychiatric/Behavioral: Negative.     Health Maintenance: Pap:  08-02-16 neg HPV HR neg History of Abnormal Pap: yes MMG:  08-11-15 neg Self Breast exams: yes Colonoscopy:  2016 normal f/u 50yrs BMD:   none TDaP:  2017 Shingles: no Pneumonia: no Hep C and HIV: both neg 2017 Labs: if needed, PCP   reports that she has never smoked. She has never used smokeless tobacco. She reports that she drank alcohol. She reports that she does not use drugs.  Past Medical History:  Diagnosis Date  . Abnormal Pap smear of cervix   . HSV (herpes simplex virus) anogenital infection   . Idiopathic edema 2010   using prn meds  . Ovarian cyst, complex 03/17/11   right 26 x 21 x 25 mm, also uterine fibroid  . UTI (lower urinary tract infection)    post coital    Past Surgical History:  Procedure Laterality Date  . AUGMENTATION MAMMAPLASTY  1997   breast implants  . COLPOSCOPY    . COLPOSCOPY W/ BIOPSY / CURETTAGE  2004   secondary to paraguqrd IUD  . INTRAUTERINE DEVICE INSERTION  05/28/11   Mirena IUD  . TUBAL LIGATION  08/23/05   follopian tube ring    Current Outpatient Medications  Medication Sig Dispense Refill  . acetaminophen (TYLENOL) 100 MG/ML solution Take 10 mg/kg by mouth every 4 (four) hours as needed for fever.    . cetirizine (ZYRTEC) 10 MG tablet Take 10 mg daily by mouth.    . hydrochlorothiazide (MICROZIDE) 12.5 MG capsule Take 12.5 mg by mouth daily.    Marland Kitchen LORazepam (ATIVAN) 1 MG tablet Take 1 mg as needed by mouth.     . Multiple Vitamin (MULTIVITAMIN) tablet Take 1 tablet by mouth daily.    . nitrofurantoin, macrocrystal-monohydrate, (MACROBID) 100 MG capsule One capsule as needed for UTI  prevention 90 capsule 3  . rizatriptan (MAXALT) 10 MG tablet Take 10 mg by mouth as needed for migraine. May repeat in 2 hours if needed    . tretinoin (RETIN-A) 0.025 % cream   0  . UNABLE TO FIND CLA    . valACYclovir (VALTREX) 1000 MG tablet Take 1/2 half tablet daily for suppression and increase to one tablet for 3 days with outbreak 90 tablet 3   No current facility-administered medications for this visit.     Family History  Problem Relation  Age of Onset  . Hypertension Father   . Cancer Father 1460       melanoma  . Diabetes Father   . Hyperlipidemia Father   . Heart disease Father   . Dementia Father   . Ovarian cancer Maternal Grandmother 35       OV cancer  . Stroke Paternal Grandmother     ROS:  Pertinent items are noted in HPI.  Otherwise, a comprehensive ROS was negative.  Exam:   BP 114/62   Pulse 68   Resp 16   Ht 5\' 2"  (1.575 m)   Wt 126 lb (57.2 kg)   LMP 04/21/2015 (Exact Date) Comment: spotting only, none since  BMI 23.05 kg/m  Height: 5\' 2"  (157.5 cm) Ht Readings from Last 3 Encounters:  10/31/18 5\' 2"  (1.575 m)  10/06/17 5\' 2"  (1.575 m)  07/30/17 5\' 2"  (1.575 m)    General  appearance: alert, cooperative and appears stated age Head: Normocephalic, without obvious abnormality, atraumatic Neck: no adenopathy, supple, symmetrical, trachea midline and thyroid normal to inspection and palpation Lungs: clear to auscultation bilaterally Breasts: normal appearance, no masses or tenderness, No nipple retraction or dimpling, No nipple discharge or bleeding, No axillary or supraclavicular adenopathy Heart: regular rate and rhythm Abdomen: soft, non-tender; no masses,  no organomegaly Extremities: extremities normal, atraumatic, no cyanosis or edema Skin: Skin color, texture, turgor normal. No rashes or lesions Lymph nodes: Cervical, supraclavicular, and axillary nodes normal. No abnormal inguinal nodes palpated Neurologic: Grossly normal   Pelvic: External genitalia:  no lesions              Urethra:  normal appearing urethra with no masses, tenderness or lesions              Bartholin's and Skene's: normal                 Vagina: normal appearing vagina with normal color and discharge, no lesions              Cervix: multiparous appearance, no cervical motion tenderness and no lesions              Pap taken: No. Bimanual Exam:  Uterus:  normal size, contour, position, consistency, mobility, non-tender and anteverted              Adnexa: normal adnexa and no mass, fullness, tenderness               Rectovaginal: Confirms               Anus:  normal sphincter tone, no lesions  Chaperone present: yes  A:  Well Woman with normal exam  Menopausal no HRT  History of Post coital UTI, no issues now  History of HSV on buttock area, needs Valtrex Rx Update  Hypertension with PCP management  P:   Reviewed health and wellness pertinent to exam  Aware of need to advise if vaginal bleeding  Has Rx Macrobid for use if needed. Discussed treating vaginal dryness with coconut or Olive oil and can also use for sexual activity  Rx Valtrex see order with instructions  Continue  follow up with PCP as indicated  Pap smear: no   counseled on breast self exam, mammography screening, menopause, adequate intake of calcium and vitamin D, diet and exercise  return annually or prn  An After Visit Summary was printed and given to the patient.

## 2018-11-02 MED FILL — TOPIRAMATE 25 MG TAB: 25 | 10 days supply | Qty: 10 | Fill #0

## 2018-11-02 MED FILL — LORazepam 0.5 MG TABS: 0.5 | 30 days supply | Qty: 30 | Fill #0

## 2018-11-24 MED FILL — valACYclovir HCL 1 GM TABS: 1 | 90 days supply | Qty: 90 | Fill #0

## 2018-11-24 MED FILL — SHIPPING COST: 1 days supply | Qty: 1 | Fill #2

## 2019-01-12 MED FILL — LEVOTHYROXINE 50 MCG TABLET: 50 | 30 days supply | Qty: 30 | Fill #0

## 2019-02-05 MED FILL — LEVOTHYROXINE 50 MCG TABLET: 50 | 30 days supply | Qty: 30 | Fill #1 | Status: TO

## 2019-03-06 MED FILL — LEVOTHYROXINE 50 MCG TABLET: 50 | 30 days supply | Qty: 30 | Fill #0

## 2019-03-06 MED FILL — valACYclovir HCL 1 GM TABS: 1 | 90 days supply | Qty: 90 | Fill #1

## 2019-04-06 MED FILL — LEVOTHYROXINE 50 MCG TABLET: 50 | 30 days supply | Qty: 30 | Fill #1

## 2019-04-24 MED FILL — BETAMETHASONE DP AUG 0.05%: 0.05 | 14 days supply | Qty: 50 | Fill #0

## 2019-05-09 MED FILL — LEVOTHYROXINE 50 MCG TABLET: 50 | 30 days supply | Qty: 30 | Fill #2

## 2019-06-01 MED FILL — valACYclovir HCL 1 GM TABS: 1 | 90 days supply | Qty: 90 | Fill #2

## 2019-06-01 MED FILL — LEVOTHYROXINE 50 MCG TABLET: 50 | 30 days supply | Qty: 30 | Fill #3

## 2019-07-04 MED FILL — LEVOTHYROXINE 50 MCG TABLET: 50 | 30 days supply | Qty: 30 | Fill #0

## 2019-07-31 MED FILL — LEVOTHYROXINE 50 MCG TABLET: 50 | 30 days supply | Qty: 30 | Fill #1

## 2019-08-24 MED FILL — LEVOTHYROXINE 50 MCG TABLET: 50 | 30 days supply | Qty: 30 | Fill #2

## 2019-09-20 MED FILL — LEVOTHYROXINE 50 MCG TABLET: 50 | 30 days supply | Qty: 30 | Fill #3

## 2019-09-20 MED FILL — valACYclovir HCL 1 GM TABS: 1 | 30 days supply | Qty: 30 | Fill #3

## 2019-10-01 MED FILL — predniSONE 10 MG TABS: 10 | 12 days supply | Qty: 24 | Fill #0

## 2019-10-08 MED FILL — DICLOFENAC SOD EC 50 MG TAB: 50 | 30 days supply | Qty: 60 | Fill #0

## 2019-10-15 MED FILL — valACYclovir HCL 1 GM TABS: 1 | 30 days supply | Qty: 30 | Fill #4

## 2019-10-15 MED FILL — LEVOTHYROXINE 50 MCG TABLET: 50 | 30 days supply | Qty: 30 | Fill #4

## 2019-10-22 ENCOUNTER — Other Ambulatory Visit: Payer: Self-pay | Admitting: Certified Nurse Midwife

## 2019-10-22 DIAGNOSIS — Z1231 Encounter for screening mammogram for malignant neoplasm of breast: Secondary | ICD-10-CM

## 2019-10-25 ENCOUNTER — Other Ambulatory Visit: Payer: Self-pay

## 2019-10-25 ENCOUNTER — Ambulatory Visit
Admission: RE | Admit: 2019-10-25 | Discharge: 2019-10-25 | Disposition: A | Discharge status: Home / Self Care | Payer: No Typology Code available for payment source | Source: Ambulatory Visit

## 2019-10-25 DIAGNOSIS — Z1231 Encounter for screening mammogram for malignant neoplasm of breast: Secondary | ICD-10-CM

## 2019-11-12 MED FILL — LEVOTHYROXINE 50 MCG TABLET: 50 | 30 days supply | Qty: 30 | Fill #5

## 2019-11-19 MED FILL — diazePAM 5 MG TABS: 5 | 1 days supply | Qty: 2 | Fill #0

## 2019-11-27 ENCOUNTER — Other Ambulatory Visit (HOSPITAL_COMMUNITY)
Admission: RE | Admit: 2019-11-27 | Discharge: 2019-11-27 | Disposition: A | Discharge status: Home / Self Care | Payer: No Typology Code available for payment source | Source: Ambulatory Visit | Attending: Certified Nurse Midwife | Admitting: Certified Nurse Midwife

## 2019-11-27 ENCOUNTER — Encounter: Payer: Self-pay | Admitting: Certified Nurse Midwife

## 2019-11-27 ENCOUNTER — Ambulatory Visit (INDEPENDENT_AMBULATORY_CARE_PROVIDER_SITE_OTHER): Payer: No Typology Code available for payment source | Admitting: Certified Nurse Midwife

## 2019-11-27 ENCOUNTER — Other Ambulatory Visit: Payer: Self-pay

## 2019-11-27 VITALS — BP 120/70 | HR 68 | Temp 97.8°F | Resp 16 | Ht 62.0 in | Wt 128.0 lb

## 2019-11-27 DIAGNOSIS — Z124 Encounter for screening for malignant neoplasm of cervix: Secondary | ICD-10-CM | POA: Insufficient documentation

## 2019-11-27 DIAGNOSIS — Z8619 Personal history of other infectious and parasitic diseases: Secondary | ICD-10-CM | POA: Diagnosis not present

## 2019-11-27 DIAGNOSIS — Z01419 Encounter for gynecological examination (general) (routine) without abnormal findings: Secondary | ICD-10-CM

## 2019-11-27 DIAGNOSIS — N390 Urinary tract infection, site not specified: Secondary | ICD-10-CM

## 2019-11-27 MED ORDER — NITROFURANTOIN MONOHYD MACRO 100 MG PO CAPS: ORAL_CAPSULE | ORAL | 3 refills | Status: AC

## 2019-11-27 MED ORDER — VALACYCLOVIR HCL 1 G PO TABS: ORAL_TABLET | ORAL | 3 refills | Status: DC

## 2019-11-27 MED FILL — NITROFURANTOIN MONO-MCR 100: 100 | 90 days supply | Qty: 90 | Fill #0

## 2019-11-27 MED FILL — valACYclovir HCL 1 GM TABS: 1 | 57 days supply | Qty: 30 | Fill #0

## 2019-11-27 NOTE — Progress Notes (Signed)
54 y.o. [id number] Married  Caucasian Fe here for annual exam. Menopausal no HRT. Denies vaginal bleeding. Some vaginal dryness, using coconut oil with good response. Had mammogram recently! Hypothyroid /migraine management with PCP. No health issues today.    Patient's last menstrual period was 04/21/2015 (exact date).          Sexually active: Yes.    The current method of family planning is tubal ligation.    Exercising: Yes.    running & workouts Smoker:  no  Review of Systems  Constitutional: Negative.   HENT: Negative.   Eyes: Negative.   Respiratory: Negative.   Cardiovascular: Negative.   Gastrointestinal: Negative.   Genitourinary: Negative.   Musculoskeletal: Negative.   Skin: Negative.   Neurological: Negative.   Endo/Heme/Allergies: Negative.   Psychiatric/Behavioral: Negative.     Health Maintenance: Pap:  08-02-16 neg HPV HR neg History of Abnormal Pap: yes MMG:  10-26-2019 category b density birads 1:neg Self Breast exams: yes Colonoscopy:  2016 normal f/u 66yrs BMD:   none TDaP:  2017 Shingles: no Pneumonia: no Hep C and HIV: both neg 2017 Labs: PCP   reports that she has never smoked. She has never used smokeless tobacco. She reports previous alcohol use. She reports that she does not use drugs.  Past Medical History:  Diagnosis Date  . Abnormal Pap smear of cervix   . Bulging of cervical intervertebral disc    c5 & 6  . HSV (herpes simplex virus) anogenital infection   . Hypothyroid   . Idiopathic edema 2010   using prn meds  . Ovarian cyst, complex 03/17/11   right 26 x 21 x 25 mm, also uterine fibroid  . UTI (lower urinary tract infection)    post coital    Past Surgical History:  Procedure Laterality Date  . AUGMENTATION MAMMAPLASTY  1997   breast implants  . COLPOSCOPY    . COLPOSCOPY W/ BIOPSY / CURETTAGE  2004   secondary to paraguqrd IUD  . INTRAUTERINE DEVICE INSERTION  05/28/11   Mirena IUD  . TUBAL LIGATION  08/23/05   follopian tube  ring    Current Outpatient Medications  Medication Sig Dispense Refill  . acetaminophen (TYLENOL) 100 MG/ML solution Take 10 mg/kg by mouth every 4 (four) hours as needed for fever.    . cetirizine (ZYRTEC) 10 MG tablet Take 10 mg daily by mouth.    Marland Kitchen GLUCOSAMINE-CHONDROITIN PO Take by mouth.    . levothyroxine (SYNTHROID) 50 MCG tablet Take 50 mcg by mouth daily before breakfast.    . LORazepam (ATIVAN) 1 MG tablet Take 1 mg as needed by mouth.     . Multiple Vitamin (MULTIVITAMIN) tablet Take 1 tablet by mouth daily.    . nitrofurantoin, macrocrystal-monohydrate, (MACROBID) 100 MG capsule One capsule as needed for UTI  prevention 90 capsule 3  . Omega-3 Fatty Acids (FISH OIL PO) Take by mouth.    . rizatriptan (MAXALT) 10 MG tablet Take 10 mg by mouth as needed for migraine. May repeat in 2 hours if needed    . tretinoin (RETIN-A) 0.025 % cream   0  . UNABLE TO FIND CLA    . valACYclovir (VALTREX) 1000 MG tablet Take 1/2 half tablet daily for suppression and increase to one tablet for 3 days with outbreak 90 tablet 3  . diazepam (VALIUM) 5 MG tablet Take 1 tablet by mouth as directed  Take 1 tablet 1 hr prior to procedure and 2nd tablet as needed  when you arrive     No current facility-administered medications for this visit.    Family History  Problem Relation Age of Onset  . Hypertension Father   . Cancer Father 49       melanoma  . Diabetes Father   . Hyperlipidemia Father   . Heart disease Father   . Dementia Father   . Ovarian cancer Maternal Grandmother 35       OV cancer  . Stroke Paternal Grandmother     ROS:  Pertinent items are noted in HPI.  Otherwise, a comprehensive ROS was negative.  Exam:   BP 120/70   Pulse 68   Temp 97.8 F (36.6 C) (Skin)   Resp 16   Ht 5\' 2"  (1.575 m)   Wt 128 lb (58.1 kg)   LMP 04/21/2015 (Exact Date) Comment: spotting only, none since  BMI 23.41 kg/m  Height: 5\' 2"  (157.5 cm) Ht Readings from Last 3 Encounters:  11/27/19 5\' 2"   (1.575 m)  10/31/18 5\' 2"  (1.575 m)  10/06/17 5\' 2"  (1.575 m)    General appearance: alert, cooperative and appears stated age Head: Normocephalic, without obvious abnormality, atraumatic Neck: no adenopathy, supple, symmetrical, trachea midline and thyroid normal to inspection and palpation Lungs: clear to auscultation bilaterally Breasts: normal appearance, no masses or tenderness, No nipple retraction or dimpling, No nipple discharge or bleeding, No axillary or supraclavicular adenopathy, Implants palpate intact (saline) bilateral Heart: regular rate and rhythm Abdomen: soft, non-tender; no masses,  no organomegaly Extremities: extremities normal, atraumatic, no cyanosis or edema Skin: Skin color, texture, turgor normal. No rashes or lesions Lymph nodes: Cervical, supraclavicular, and axillary nodes normal. No abnormal inguinal nodes palpated Neurologic: Grossly normal   Pelvic: External genitalia:  no lesions              Urethra:  normal appearing urethra with no masses, tenderness or lesions              Bartholin's and Skene's: normal                 Vagina: normal appearing vagina with normal color and discharge, no lesions              Cervix: multiparous appearance, no cervical motion tenderness, no lesions and normal appearance              Pap taken: Yes.   Bimanual Exam:  Uterus:  normal size, contour, position, consistency, mobility, non-tender and anteverted              Adnexa: normal adnexa and no mass, fullness, tenderness               Rectovaginal: Confirms               Anus:  normal sphincter tone, no lesions  Chaperone present: yes  A:  Well Woman with normal exam  Menopausal not symptomatic now  History of post coital UTI Macrobid working well  HSV history needs Valtrex update  Hypothyroid, migraine headache management with PCP    P:   Reviewed health and wellness pertinent to exam  Aware of need to advise if vaginal bleeding  Will update Rx for  Macrobid  Will update Rx Valtrex  Continue follow up with PCP as indicated  Pap smear: yes   counseled on breast self exam, mammography screening, feminine hygiene, menopause, adequate intake of calcium and vitamin D, diet and exercise  return annually or prn  An After  Visit Summary was printed and given to the patient.

## 2019-11-27 NOTE — Patient Instructions (Signed)

## 2019-12-10 MED FILL — LEVOTHYROXINE 50 MCG TABLET: 50 | 30 days supply | Qty: 30 | Fill #0

## 2019-12-14 LAB — CYTOLOGY - PAP: Diagnosis: NEGATIVE

## 2020-01-04 MED FILL — DICLOFENAC SOD EC 50 MG TAB: 50 | 30 days supply | Qty: 60 | Fill #1

## 2020-01-07 MED FILL — LEVOTHYROXINE 50 MCG TABLET: 50 | 30 days supply | Qty: 30 | Fill #1

## 2020-01-11 MED FILL — valACYclovir HCL 1 GM TABS: 1 | 57 days supply | Qty: 30 | Fill #1

## 2020-02-11 ENCOUNTER — Encounter: Payer: Self-pay | Admitting: Certified Nurse Midwife

## 2020-02-13 MED FILL — LEVOTHYROXINE 50 MCG TABLET: 50 | 30 days supply | Qty: 30 | Fill #2

## 2020-02-18 ENCOUNTER — Other Ambulatory Visit: Payer: Self-pay

## 2020-02-18 DIAGNOSIS — Z8619 Personal history of other infectious and parasitic diseases: Secondary | ICD-10-CM

## 2020-02-18 NOTE — Telephone Encounter (Signed)
Medication refill request: valacyclovir hcl 1gm tablet Last AEX:  11-27-2019 Next AEX: 11-28-2020 Last MMG (if hormonal medication request): n/a Refill authorized: rx was originally sent at [facility], directions stated take 1/2 tablet po daily for suppression & increase to 1 tablet for 3 days with outbreak. Pharmacy sent request asking for a change in directions. Patient states she is taking 1 tablet daily. Please send in new rx if appropriate.

## 2020-02-18 NOTE — Telephone Encounter (Signed)
Can you please speak with the patient and find out why she is taking 1 gram daily? Was she having frequent outbreaks on the 500 mg daily? If she was having frequent outbreaks on the 500 mg, then call in 1 gram daily, increase to BID x 3 days with an outbreak. If not, then I would recommend 500 mg daily, increase to BID with an outbreak.

## 2020-02-19 MED ORDER — VALACYCLOVIR HCL 1 G PO TABS: ORAL_TABLET | ORAL | 3 refills | Status: DC

## 2020-02-19 NOTE — Telephone Encounter (Signed)
Spoke to pt. Pt states does take 1 gm daily for suppression due to having frequent outbreaks on 500mg  daily. Pt informed of Rx request and will wait for pharmacy to call when ready. Pt has no current outbreak.   Routing to Dr for request. #90, 3 RF pended.

## 2020-03-04 MED FILL — valACYclovir HCL 1 GM TABS: 1 | 45 days supply | Qty: 90 | Fill #0

## 2020-03-10 MED FILL — LEVOTHYROXINE 50 MCG TABLET: 50 | 30 days supply | Qty: 30 | Fill #3

## 2020-04-03 ENCOUNTER — Other Ambulatory Visit (HOSPITAL_COMMUNITY): Payer: Self-pay | Admitting: Internal Medicine

## 2020-04-12 MED FILL — LEVOTHYROXINE 50 MCG TABLET: 50 | 30 days supply | Qty: 30 | Fill #0

## 2020-05-25 MED FILL — LEVOTHYROXINE 50 MCG TABLET: 50 | 30 days supply | Qty: 30 | Fill #1

## 2020-05-27 MED FILL — MUPIROCIN 2% OINTMENT: 2 | 30 days supply | Qty: 22 | Fill #0

## 2020-06-19 MED FILL — valACYclovir HCL 1 GM TABS: 1 | 45 days supply | Qty: 90 | Fill #1

## 2020-06-19 MED FILL — LEVOTHYROXINE 50 MCG TABLET: 50 | 30 days supply | Qty: 30 | Fill #2

## 2020-07-22 MED FILL — LEVOTHYROXINE 50 MCG TABLET: 50 | 30 days supply | Qty: 30 | Fill #3

## 2020-08-15 MED FILL — LEVOTHYROXINE 50 MCG TABLET: 50 | 30 days supply | Qty: 30 | Fill #4

## 2020-09-30 MED FILL — LEVOTHYROXINE 50 MCG TABLET: 50 | 30 days supply | Qty: 30 | Fill #5

## 2020-09-30 MED FILL — valACYclovir HCL 1 GM TABS: 1 | 45 days supply | Qty: 90 | Fill #2

## 2020-11-28 ENCOUNTER — Ambulatory Visit: Payer: No Typology Code available for payment source | Admitting: Certified Nurse Midwife

## 2020-12-05 ENCOUNTER — Other Ambulatory Visit (HOSPITAL_COMMUNITY): Payer: Self-pay | Admitting: Internal Medicine

## 2020-12-05 MED FILL — LEVOTHYROXINE 50 MCG TABLET: 50 | 90 days supply | Qty: 90 | Fill #0

## 2021-01-26 ENCOUNTER — Other Ambulatory Visit (HOSPITAL_COMMUNITY): Payer: Self-pay | Admitting: Pharmacist

## 2021-01-26 MED FILL — CARESTART COVID-19 HOME TES: 4 days supply | Qty: 4 | Fill #0

## 2021-02-12 ENCOUNTER — Other Ambulatory Visit (HOSPITAL_BASED_OUTPATIENT_CLINIC_OR_DEPARTMENT_OTHER): Payer: Self-pay

## 2021-02-24 ENCOUNTER — Other Ambulatory Visit (HOSPITAL_COMMUNITY): Payer: Self-pay

## 2021-02-24 MED FILL — Levothyroxine Sodium Tab 50 MCG: ORAL | 90 days supply | Qty: 90 | Fill #0 | Status: CN

## 2021-03-06 ENCOUNTER — Other Ambulatory Visit (HOSPITAL_COMMUNITY): Payer: Self-pay

## 2021-03-11 ENCOUNTER — Other Ambulatory Visit (HOSPITAL_COMMUNITY): Payer: Self-pay

## 2021-03-11 MED FILL — Levothyroxine Sodium Tab 50 MCG: ORAL | 90 days supply | Qty: 90 | Fill #0 | Status: AC

## 2021-03-13 ENCOUNTER — Other Ambulatory Visit (HOSPITAL_COMMUNITY): Payer: Self-pay

## 2021-03-20 ENCOUNTER — Other Ambulatory Visit (HOSPITAL_COMMUNITY): Payer: Self-pay

## 2021-03-20 MED ORDER — TRETINOIN 0.05 % EX CREA
TOPICAL_CREAM | CUTANEOUS | 3 refills | Status: AC
  Filled 2021-03-20: qty 20, 30d supply, fill #0
  Filled 2021-03-20 – 2021-04-01 (×2): qty 60, 90d supply, fill #0
  Filled 2021-07-16: qty 60, 90d supply, fill #1
  Filled 2021-12-10: qty 60, 90d supply, fill #2
  Filled 2022-03-12: qty 60, 90d supply, fill #3

## 2021-03-20 MED ORDER — VALACYCLOVIR HCL 1 G PO TABS
500.0000 mg | ORAL_TABLET | Freq: Every day | ORAL | 0 refills | Status: DC
  Filled 2021-03-20: qty 30, 60d supply, fill #0

## 2021-03-20 MED ORDER — ACYCLOVIR 5 % EX CREA
TOPICAL_CREAM | CUTANEOUS | 3 refills | Status: AC
  Filled 2021-03-20: qty 15, 90d supply, fill #0

## 2021-04-01 ENCOUNTER — Other Ambulatory Visit (HOSPITAL_COMMUNITY): Payer: Self-pay

## 2021-04-02 ENCOUNTER — Other Ambulatory Visit (HOSPITAL_COMMUNITY): Payer: Self-pay

## 2021-04-02 MED ORDER — LORAZEPAM 0.5 MG PO TABS
ORAL_TABLET | ORAL | 1 refills | Status: AC
  Filled 2021-04-02: qty 30, 30d supply, fill #0

## 2021-05-12 ENCOUNTER — Other Ambulatory Visit (HOSPITAL_COMMUNITY): Payer: Self-pay

## 2021-05-12 MED ORDER — TRIAMCINOLONE ACETONIDE 0.1 % EX CREA
TOPICAL_CREAM | Freq: Two times a day (BID) | CUTANEOUS | 2 refills | Status: AC
  Filled 2021-05-12: qty 80, 30d supply, fill #0
  Filled 2022-03-12: qty 80, 30d supply, fill #1

## 2021-05-18 ENCOUNTER — Ambulatory Visit: Payer: Self-pay

## 2021-05-18 ENCOUNTER — Other Ambulatory Visit: Payer: Self-pay

## 2021-05-18 ENCOUNTER — Other Ambulatory Visit: Payer: Self-pay | Admitting: Occupational Medicine

## 2021-05-18 DIAGNOSIS — M79674 Pain in right toe(s): Secondary | ICD-10-CM

## 2021-06-08 ENCOUNTER — Other Ambulatory Visit (HOSPITAL_COMMUNITY): Payer: Self-pay

## 2021-06-08 MED FILL — Levothyroxine Sodium Tab 50 MCG: ORAL | 90 days supply | Qty: 90 | Fill #1 | Status: AC

## 2021-06-09 ENCOUNTER — Other Ambulatory Visit: Payer: Self-pay | Admitting: Internal Medicine

## 2021-06-11 ENCOUNTER — Other Ambulatory Visit: Payer: Self-pay

## 2021-06-11 ENCOUNTER — Other Ambulatory Visit: Payer: Self-pay | Admitting: Internal Medicine

## 2021-06-11 ENCOUNTER — Ambulatory Visit
Admission: RE | Admit: 2021-06-11 | Discharge: 2021-06-11 | Disposition: A | Discharge status: Home / Self Care | Payer: No Typology Code available for payment source | Source: Ambulatory Visit | Attending: Internal Medicine | Admitting: Internal Medicine

## 2021-06-11 DIAGNOSIS — N6310 Unspecified lump in the right breast, unspecified quadrant: Secondary | ICD-10-CM

## 2021-07-16 ENCOUNTER — Other Ambulatory Visit (HOSPITAL_COMMUNITY): Payer: Self-pay

## 2021-07-17 ENCOUNTER — Other Ambulatory Visit (HOSPITAL_COMMUNITY): Payer: Self-pay

## 2021-07-28 IMAGING — MG DIGITAL SCREENING BREAST BILAT IMPLANT W/ TOMO W/ CAD
9 of 12 series · 9 of 28 positions shown · non-contrast
Comparison: Previous exam(s).

CLINICAL DATA: Screening.

EXAM:
DIGITAL SCREENING BILATERAL MAMMOGRAM WITH IMPLANTS, CAD AND TOMO
The patient has retroglandular implants. Standard and implant
displaced views were performed.

[R MLO]
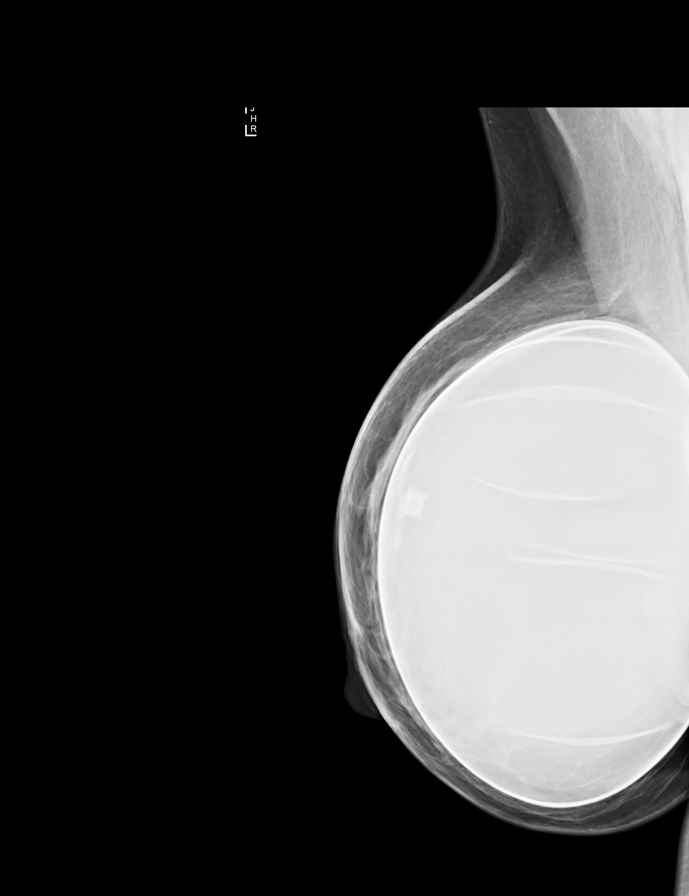

[R CC]
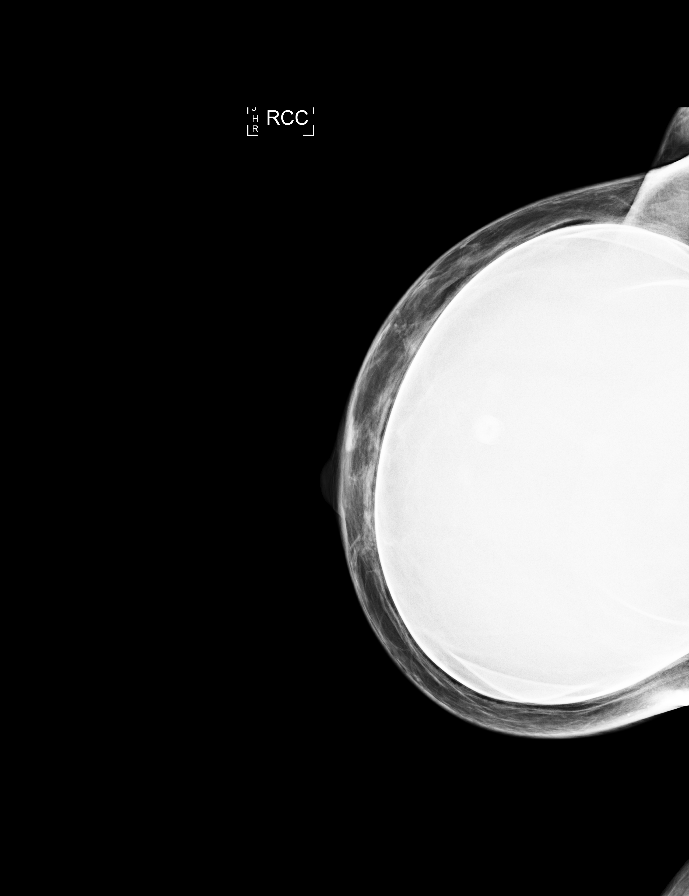

[L MLO]
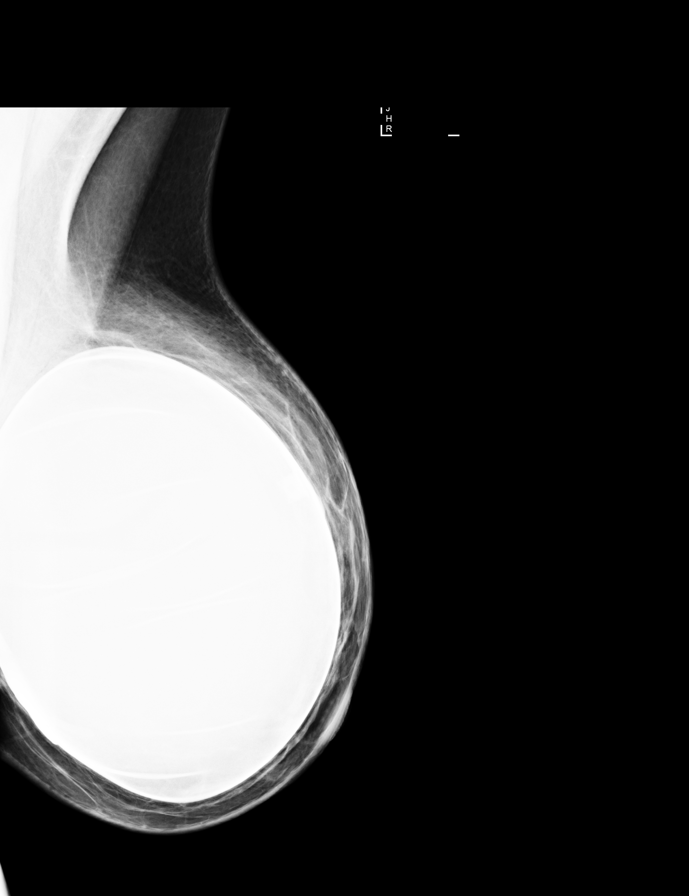

[L CC]
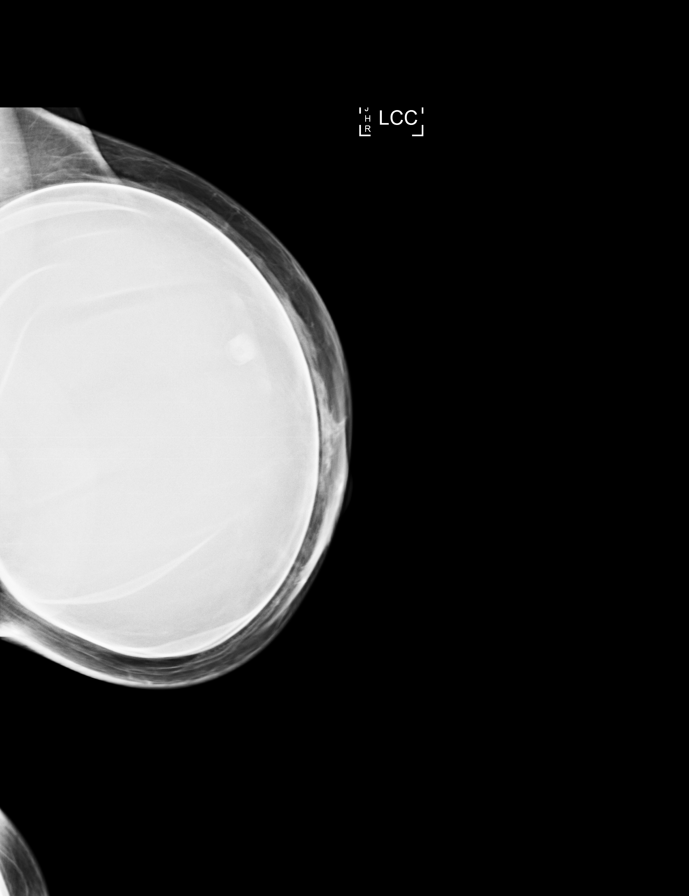

[L MLO synth-2D]
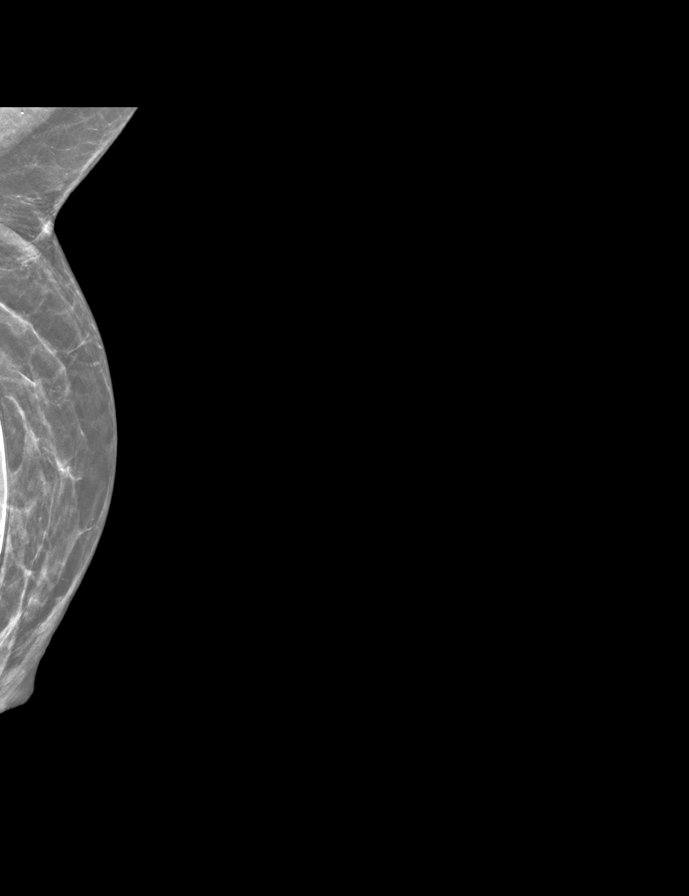

[R MLO synth-2D]
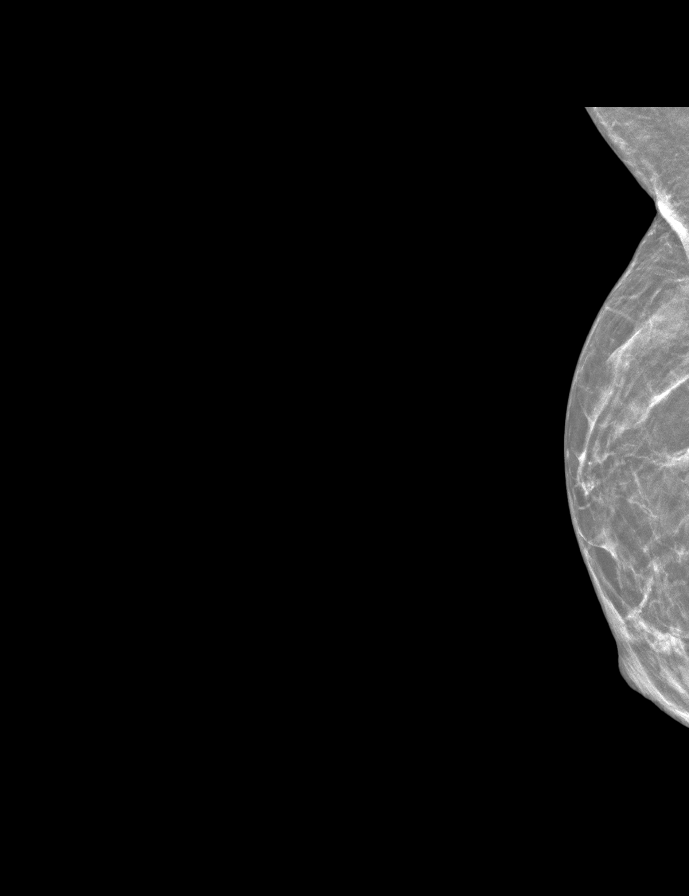

[L CC synth-2D]
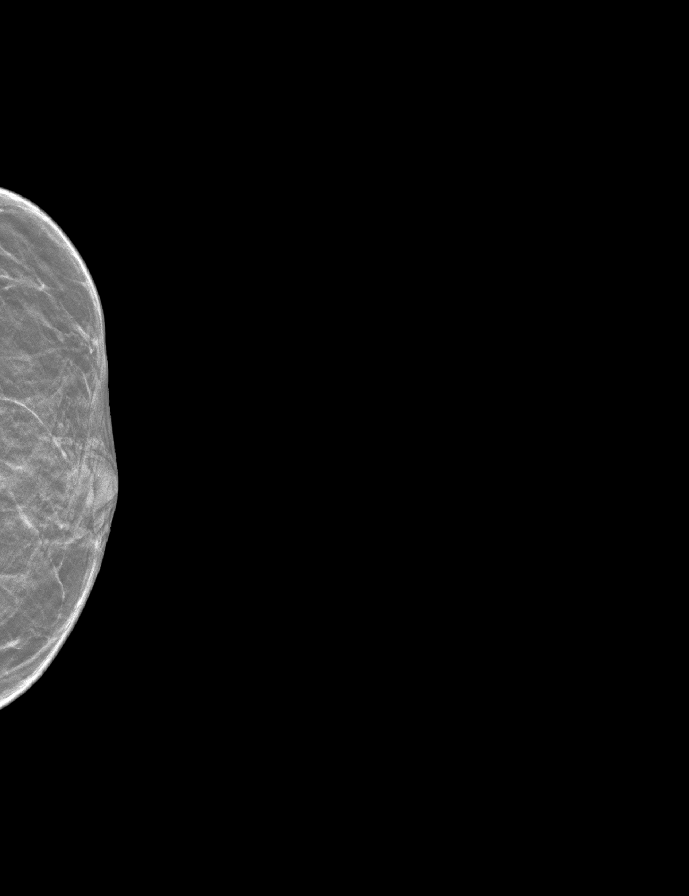

[R CC synth-2D]
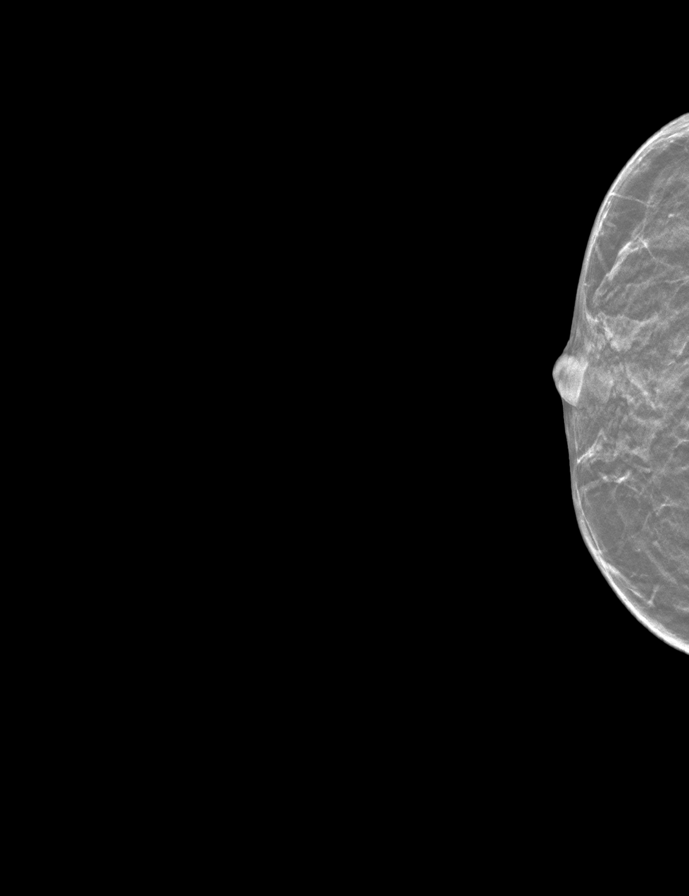

[R CCID BREAST TOMOSYNTHESIS IMAGE tomo · tomo slice 10/19.0]
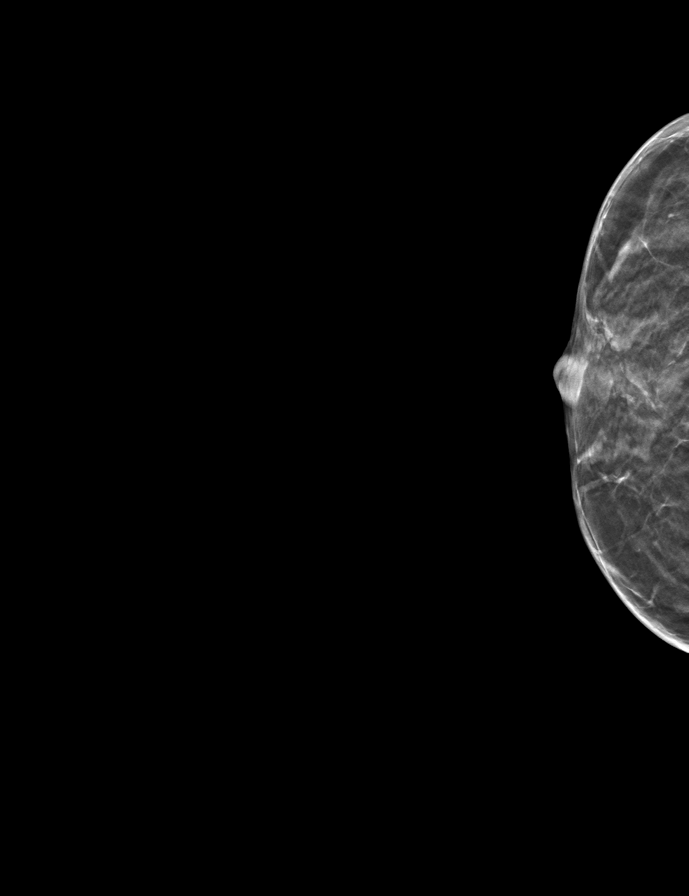

[9 of 28 positions shown; findings below may reference images not displayed]

ACR Breast Density Category b: There are scattered areas of
fibroglandular density.
FINDINGS: There are no findings suspicious for malignancy. Images were
processed with CAD.
IMPRESSION: No mammographic evidence of malignancy. A result letter of this
screening mammogram will be mailed directly to the patient.

RECOMMENDATION:
Screening mammogram in one year. (Code:VZ-A-PDF)

BI-RADS CATEGORY  1:  Negative.

## 2021-09-06 MED FILL — Levothyroxine Sodium Tab 50 MCG: ORAL | 90 days supply | Qty: 90 | Fill #2 | Status: AC

## 2021-09-07 ENCOUNTER — Other Ambulatory Visit (HOSPITAL_COMMUNITY): Payer: Self-pay

## 2021-09-14 ENCOUNTER — Other Ambulatory Visit: Payer: Self-pay | Admitting: Internal Medicine

## 2021-09-14 ENCOUNTER — Ambulatory Visit
Admission: RE | Admit: 2021-09-14 | Discharge: 2021-09-14 | Disposition: A | Discharge status: Home / Self Care | Payer: No Typology Code available for payment source | Source: Ambulatory Visit | Attending: Internal Medicine | Admitting: Internal Medicine

## 2021-09-14 DIAGNOSIS — N6310 Unspecified lump in the right breast, unspecified quadrant: Secondary | ICD-10-CM

## 2021-10-19 ENCOUNTER — Other Ambulatory Visit (HOSPITAL_COMMUNITY): Payer: Self-pay

## 2021-10-19 MED ORDER — HYDROCHLOROTHIAZIDE 12.5 MG PO TABS
ORAL_TABLET | ORAL | 0 refills | Status: AC
  Filled 2021-10-19: qty 90, 90d supply, fill #0

## 2021-12-08 ENCOUNTER — Other Ambulatory Visit (HOSPITAL_COMMUNITY): Payer: Self-pay

## 2021-12-08 MED ORDER — ESTRADIOL 0.1 MG/GM VA CREA
TOPICAL_CREAM | VAGINAL | 3 refills | Status: DC
  Filled 2021-12-08: qty 42.5, 90d supply, fill #0
  Filled 2022-03-12: qty 42.5, 90d supply, fill #1
  Filled 2022-06-08: qty 42.5, 90d supply, fill #2
  Filled 2022-09-05: qty 42.5, 90d supply, fill #3

## 2021-12-10 ENCOUNTER — Other Ambulatory Visit (HOSPITAL_COMMUNITY): Payer: Self-pay

## 2021-12-10 MED ORDER — TOPIRAMATE 25 MG PO TABS
ORAL_TABLET | ORAL | 3 refills | Status: AC
  Filled 2021-12-10: qty 30, 30d supply, fill #0

## 2021-12-10 MED ORDER — VALACYCLOVIR HCL 1 G PO TABS
ORAL_TABLET | ORAL | 0 refills | Status: DC
  Filled 2021-12-10: qty 30, 60d supply, fill #0

## 2021-12-10 MED ORDER — LORAZEPAM 0.5 MG PO TABS
ORAL_TABLET | ORAL | 0 refills | Status: AC
  Filled 2021-12-10: qty 30, 30d supply, fill #0

## 2021-12-11 ENCOUNTER — Other Ambulatory Visit (HOSPITAL_COMMUNITY): Payer: Self-pay

## 2021-12-14 ENCOUNTER — Other Ambulatory Visit (HOSPITAL_COMMUNITY): Payer: Self-pay

## 2021-12-14 MED ORDER — LEVOTHYROXINE SODIUM 50 MCG PO TABS
ORAL_TABLET | ORAL | 3 refills | Status: DC
Start: 2021-12-14 — End: 2023-06-07
  Filled 2021-12-14: qty 90, 90d supply, fill #0
  Filled 2022-03-12: qty 90, 90d supply, fill #1
  Filled 2022-06-05: qty 90, 90d supply, fill #2
  Filled 2022-09-05: qty 90, 90d supply, fill #3

## 2021-12-24 ENCOUNTER — Other Ambulatory Visit (HOSPITAL_COMMUNITY): Payer: Self-pay

## 2022-03-12 ENCOUNTER — Other Ambulatory Visit (HOSPITAL_COMMUNITY): Payer: Self-pay

## 2022-03-15 ENCOUNTER — Other Ambulatory Visit (HOSPITAL_COMMUNITY): Payer: Self-pay

## 2022-03-16 ENCOUNTER — Other Ambulatory Visit (HOSPITAL_COMMUNITY): Payer: Self-pay

## 2022-06-05 ENCOUNTER — Other Ambulatory Visit (HOSPITAL_COMMUNITY): Payer: Self-pay

## 2022-06-08 ENCOUNTER — Other Ambulatory Visit (HOSPITAL_COMMUNITY): Payer: Self-pay

## 2022-06-09 ENCOUNTER — Other Ambulatory Visit (HOSPITAL_COMMUNITY): Payer: Self-pay

## 2022-06-09 MED ORDER — LORAZEPAM 0.5 MG PO TABS
ORAL_TABLET | ORAL | 0 refills | Status: AC
  Filled 2022-06-09: qty 30, 30d supply, fill #0

## 2022-06-15 ENCOUNTER — Ambulatory Visit
Admission: RE | Admit: 2022-06-15 | Discharge: 2022-06-15 | Disposition: A | Discharge status: Home / Self Care | Payer: No Typology Code available for payment source | Source: Ambulatory Visit | Attending: Internal Medicine | Admitting: Internal Medicine

## 2022-06-15 ENCOUNTER — Other Ambulatory Visit: Payer: Self-pay | Admitting: Internal Medicine

## 2022-06-15 DIAGNOSIS — N6310 Unspecified lump in the right breast, unspecified quadrant: Secondary | ICD-10-CM

## 2022-09-06 ENCOUNTER — Other Ambulatory Visit (HOSPITAL_COMMUNITY): Payer: Self-pay

## 2022-12-09 DIAGNOSIS — E039 Hypothyroidism, unspecified: Secondary | ICD-10-CM | POA: Diagnosis not present

## 2022-12-09 DIAGNOSIS — Z1212 Encounter for screening for malignant neoplasm of rectum: Secondary | ICD-10-CM | POA: Diagnosis not present

## 2022-12-09 DIAGNOSIS — D72819 Decreased white blood cell count, unspecified: Secondary | ICD-10-CM | POA: Diagnosis not present

## 2022-12-09 DIAGNOSIS — R7989 Other specified abnormal findings of blood chemistry: Secondary | ICD-10-CM | POA: Diagnosis not present

## 2022-12-21 ENCOUNTER — Other Ambulatory Visit: Payer: Self-pay | Admitting: Internal Medicine

## 2022-12-21 DIAGNOSIS — E039 Hypothyroidism, unspecified: Secondary | ICD-10-CM | POA: Diagnosis not present

## 2022-12-21 DIAGNOSIS — Z0000 Encounter for general adult medical examination without abnormal findings: Secondary | ICD-10-CM | POA: Diagnosis not present

## 2022-12-21 DIAGNOSIS — R600 Localized edema: Secondary | ICD-10-CM | POA: Diagnosis not present

## 2022-12-21 DIAGNOSIS — G4700 Insomnia, unspecified: Secondary | ICD-10-CM | POA: Diagnosis not present

## 2022-12-21 DIAGNOSIS — G43909 Migraine, unspecified, not intractable, without status migrainosus: Secondary | ICD-10-CM | POA: Diagnosis not present

## 2022-12-21 DIAGNOSIS — N390 Urinary tract infection, site not specified: Secondary | ICD-10-CM | POA: Diagnosis not present

## 2022-12-21 DIAGNOSIS — B001 Herpesviral vesicular dermatitis: Secondary | ICD-10-CM | POA: Diagnosis not present

## 2022-12-21 DIAGNOSIS — M542 Cervicalgia: Secondary | ICD-10-CM | POA: Diagnosis not present

## 2022-12-21 DIAGNOSIS — R82998 Other abnormal findings in urine: Secondary | ICD-10-CM | POA: Diagnosis not present

## 2022-12-21 DIAGNOSIS — D72819 Decreased white blood cell count, unspecified: Secondary | ICD-10-CM | POA: Diagnosis not present

## 2022-12-21 DIAGNOSIS — R1013 Epigastric pain: Secondary | ICD-10-CM | POA: Diagnosis not present

## 2022-12-22 ENCOUNTER — Other Ambulatory Visit (HOSPITAL_COMMUNITY): Payer: Self-pay

## 2022-12-22 ENCOUNTER — Other Ambulatory Visit: Payer: Self-pay

## 2022-12-22 MED ORDER — LORAZEPAM 0.5 MG PO TABS
0.5000 mg | ORAL_TABLET | Freq: Every day | ORAL | 0 refills | Status: AC | PRN
  Filled 2022-12-22: qty 30, 30d supply, fill #0

## 2022-12-22 MED ORDER — BIMATOPROST 0.03 % OP SOLN
OPHTHALMIC | 0 refills | Status: DC
  Filled 2022-12-22 – 2022-12-24 (×2): qty 2.5, 50d supply, fill #0

## 2022-12-22 MED ORDER — TOPIRAMATE 25 MG PO TABS
25.0000 mg | ORAL_TABLET | Freq: Every day | ORAL | 0 refills | Status: DC | PRN
  Filled 2022-12-23: qty 90, 90d supply, fill #0

## 2022-12-22 MED ORDER — HYDROCHLOROTHIAZIDE 12.5 MG PO TABS: 12.5000 mg | ORAL_TABLET | Freq: Every day | ORAL | 0 refills | Status: AC | PRN

## 2022-12-22 MED ORDER — LANSOPRAZOLE 30 MG PO CPDR
30.0000 mg | DELAYED_RELEASE_CAPSULE | Freq: Two times a day (BID) | ORAL | 0 refills | Status: DC
  Filled 2022-12-22: qty 180, 90d supply, fill #0

## 2022-12-22 MED ORDER — LEVOTHYROXINE SODIUM 50 MCG PO TABS
50.0000 ug | ORAL_TABLET | Freq: Every day | ORAL | 3 refills | Status: AC
  Filled 2022-12-22 (×2): qty 90, 90d supply, fill #0
  Filled 2023-03-14 – 2023-03-15 (×2): qty 90, 90d supply, fill #1

## 2022-12-23 ENCOUNTER — Other Ambulatory Visit (HOSPITAL_COMMUNITY): Payer: Self-pay

## 2022-12-23 ENCOUNTER — Other Ambulatory Visit: Payer: Self-pay

## 2022-12-23 MED ORDER — VALACYCLOVIR HCL 1 G PO TABS
500.0000 mg | ORAL_TABLET | Freq: Every day | ORAL | 0 refills | Status: DC
  Filled 2022-12-23 – 2022-12-28 (×2): qty 30, 60d supply, fill #0

## 2022-12-23 MED ORDER — ESTRADIOL 0.1 MG/GM VA CREA
1.0000 g | TOPICAL_CREAM | VAGINAL | 3 refills | Status: DC
  Filled 2022-12-23 – 2022-12-28 (×2): qty 42.5, 90d supply, fill #0
  Filled 2023-06-06: qty 42.5, 90d supply, fill #1
  Filled 2023-08-30: qty 42.5, 90d supply, fill #2
  Filled 2023-11-29: qty 42.5, 90d supply, fill #3

## 2022-12-24 ENCOUNTER — Other Ambulatory Visit (HOSPITAL_COMMUNITY): Payer: Self-pay

## 2022-12-24 ENCOUNTER — Other Ambulatory Visit: Payer: Self-pay

## 2022-12-28 ENCOUNTER — Other Ambulatory Visit (HOSPITAL_COMMUNITY): Payer: Self-pay

## 2022-12-29 ENCOUNTER — Ambulatory Visit
Admission: RE | Admit: 2022-12-29 | Discharge: 2022-12-29 | Disposition: A | Discharge status: Home / Self Care | Payer: 59 | Source: Ambulatory Visit | Attending: Internal Medicine | Admitting: Internal Medicine

## 2022-12-29 DIAGNOSIS — Z0000 Encounter for general adult medical examination without abnormal findings: Secondary | ICD-10-CM

## 2022-12-29 DIAGNOSIS — E7800 Pure hypercholesterolemia, unspecified: Secondary | ICD-10-CM | POA: Diagnosis not present

## 2022-12-30 ENCOUNTER — Telehealth: Payer: 59 | Admitting: Nurse Practitioner

## 2022-12-30 DIAGNOSIS — H1032 Unspecified acute conjunctivitis, left eye: Secondary | ICD-10-CM

## 2022-12-30 MED ORDER — POLYMYXIN B-TRIMETHOPRIM 10000-0.1 UNIT/ML-% OP SOLN: 1.0000 [drp] | Freq: Four times a day (QID) | OPHTHALMIC | 0 refills | Status: AC

## 2022-12-30 NOTE — Progress Notes (Signed)
Virtual Visit Consent   [name], you are scheduled for a virtual visit with a [facility] provider today. Just as with appointments in the office, your consent must be obtained to participate. Your consent will be active for this visit and any virtual visit you may have with one of our providers in the next 365 days. If you have a MyChart account, a copy of this consent can be sent to you electronically.  As this is a virtual visit, video technology does not allow for your provider to perform a traditional examination. This may limit your provider's ability to fully assess your condition. If your provider identifies any concerns that need to be evaluated in person or the need to arrange testing (such as labs, EKG, etc.), we will make arrangements to do so. Although advances in technology are sophisticated, we cannot ensure that it will always work on either your end or our end. If the connection with a video visit is poor, the visit may have to be switched to a telephone visit. With either a video or telephone visit, we are not always able to ensure that we have a secure connection.  By engaging in this virtual visit, you consent to the provision of healthcare and authorize for your insurance to be billed (if applicable) for the services provided during this visit. Depending on your insurance coverage, you may receive a charge related to this service.  I need to obtain your verbal consent now. Are you willing to proceed with your visit today? Jessica Hartman has provided verbal consent on 12/30/2022 for a virtual visit (video or telephone). Jessica Schneiders, FNP  Date: 12/30/2022 2:09 PM  Virtual Visit via Video Note   I, Jessica Hartman, connected with  Jessica Hartman  ([id number], December 25, 1965) on 12/30/22 at  2:15 PM EST by a video-enabled telemedicine application and verified that I am speaking with the correct person using two identifiers.  Location: Patient: Virtual Visit Location Patient:  Home Provider: Virtual Visit Location Provider: Home Office   I discussed the limitations of evaluation and management by telemedicine and the availability of in person appointments. The patient expressed understanding and agreed to proceed.    History of Present Illness: Jessica Hartman is a 57 y.o. who identifies as a female who was assigned female at birth, and is being seen today for left eye redness and drainage.  She has continued to feel like something was in her eye.  She was at the dentist yesterday  Denies oral pain or facial pain   She does typically wear contacts that are daily disposable   She denies difficulty keeping her eye open    Problems:  Patient Active Problem List   Diagnosis Date Noted   Adenomyosis 07/22/2014   IUD (intrauterine device) in place 07/22/2014    Allergies:  Allergies  Allergen Reactions   Clindamycin/Lincomycin Nausea Only    GI SE   Erythromycin     GI   Lidocaine Palpitations   Medications:  Current Outpatient Medications:    acetaminophen (TYLENOL) 100 MG/ML solution, Take 10 mg/kg by mouth every 4 (four) hours as needed for fever., Disp: , Rfl:    acyclovir cream (ZOVIRAX) 5 %, Apply to affected area up to 5 times per day for up to 4 days, Disp: 15 g, Rfl: 3   bimatoprost (LUMIGAN) 0.03 % ophthalmic solution, Instill 1 drop into affected eye (upper eyelid/eyelash line) once daily in the evening, Disp: 2.5 mL, Rfl: 0  cetirizine (ZYRTEC) 10 MG tablet, Take 10 mg daily by mouth., Disp: , Rfl:    diazepam (VALIUM) 5 MG tablet, Take 1 tablet by mouth as directed  Take 1 tablet 1 hr prior to procedure and 2nd tablet as needed when you arrive, Disp: , Rfl:    estradiol (ESTRACE VAGINAL) 0.1 MG/GM vaginal cream, Insert 1 gram vaginally 2 to 3 times per week as directed, Disp: 42.5 g, Rfl: 3   GLUCOSAMINE-CHONDROITIN PO, Take by mouth., Disp: , Rfl:    hydrochlorothiazide (HYDRODIURIL) 12.5 MG tablet, TAKE 1 TABLET BY MOUTH ONCE A DAY AS  NEEDED FOR EDEMA (SWELLING)., Disp: 90 tablet, Rfl: 0   hydrochlorothiazide (HYDRODIURIL) 12.5 MG tablet, Take 1 tablet (12.5 mg total) by mouth daily as needed for edema, Disp: 90 tablet, Rfl: 0   lansoprazole (PREVACID) 30 MG capsule, Take 1 capsule (30 mg total) by mouth 2 (two) times daily., Disp: 180 capsule, Rfl: 0   levothyroxine (SYNTHROID) 50 MCG tablet, Take 50 mcg by mouth daily before breakfast., Disp: , Rfl:    levothyroxine (SYNTHROID) 50 MCG tablet, TAKE 1 TABLET BY MOUTH ONCE A DAY, Disp: 90 tablet, Rfl: 3   levothyroxine (SYNTHROID) 50 MCG tablet, TAKE 1 TABLET BY MOUTH ONCE A DAY, Disp: 30 tablet, Rfl: 5   levothyroxine (SYNTHROID) 50 MCG tablet, TAKE 1 TABLET BY MOUTH ONCE A DAY, Disp: 90 tablet, Rfl: 3   levothyroxine (SYNTHROID) 50 MCG tablet, Take 1 tablet (50 mcg total) by mouth daily., Disp: 90 tablet, Rfl: 3   LORazepam (ATIVAN) 0.5 MG tablet, Take 1 tablet by mouth daily as needed once a day for 30 days, Disp: 30 tablet, Rfl: 1   LORazepam (ATIVAN) 0.5 MG tablet, TAKE 1 TABLET BY MOUTH DAILY AS NEEDED, Disp: 30 tablet, Rfl: 0   LORazepam (ATIVAN) 0.5 MG tablet, TAKE 1 TABLET BY MOUTH DAILY AS NEEDED, Disp: 30 tablet, Rfl: 0   LORazepam (ATIVAN) 0.5 MG tablet, Take 1 tablet (0.5 mg total) by mouth daily as needed., Disp: 30 tablet, Rfl: 0   LORazepam (ATIVAN) 1 MG tablet, Take 1 mg as needed by mouth. , Disp: , Rfl:    Multiple Vitamin (MULTIVITAMIN) tablet, Take 1 tablet by mouth daily., Disp: , Rfl:    nitrofurantoin, macrocrystal-monohydrate, (MACROBID) 100 MG capsule, One capsule as needed for UTI  prevention, Disp: 90 capsule, Rfl: 3   Omega-3 Fatty Acids (FISH OIL PO), Take by mouth., Disp: , Rfl:    rizatriptan (MAXALT) 10 MG tablet, Take 10 mg by mouth as needed for migraine. May repeat in 2 hours if needed, Disp: , Rfl:    topiramate (TOPAMAX) 25 MG tablet, Take 1 tablet by mouth once daily as needed, Disp: 30 tablet, Rfl: 3   topiramate (TOPAMAX) 25 MG tablet,  Take 1 tablet (25 mg total) by mouth daily as needed., Disp: 90 tablet, Rfl: 0   tretinoin (RETIN-A) 0.025 % cream, , Disp: , Rfl: 0   tretinoin (RETIN-A) 0.05 % cream, APPLY TO AFFECTED AREA(S) ONCE PER DAY IN SUMMER MONTHS AS DIRECTED, Disp: 60 g, Rfl: 3   triamcinolone cream (KENALOG) 0.1 %, Apply 1 application on the skin twice a day, Disp: 80 g, Rfl: 2   UNABLE TO FIND, CLA, Disp: , Rfl:    valACYclovir (VALTREX) 1000 MG tablet, Take 1 po daily and increase to twice a day for 3 days when having outbreak., Disp: 90 tablet, Rfl: 3   valACYclovir (VALTREX) 1000 MG tablet, Take 1/2 tablet (500  mg total) by mouth daily., Disp: 30 tablet, Rfl: 0   valACYclovir (VALTREX) 1000 MG tablet, Take 0.5 tablets (500 mg total) by mouth daily., Disp: 30 tablet, Rfl: 0  Observations/Objective: Patient is well-developed, well-nourished in no acute distress.  Resting comfortably  at home.  Head is normocephalic, atraumatic.  No labored breathing.  Speech is clear and coherent with logical content.  Patient is alert and oriented at baseline.  Erythema to left sclera, upper left lid with slight edema, slightly injected to left conjunctival area    Assessment and Plan: 1. Acute bacterial conjunctivitis of left eye May alternate with artificial tears If no improvement or with worsening symptoms in the next 24-48 hours schedule follow up with opthalmologic  Continue to wear glasses until symptoms improve avoid contact use   - trimethoprim-polymyxin b (POLYTRIM) ophthalmic solution; Place 1 drop into the left eye 4 (four) times daily for 7 days.  Dispense: 10 mL; Refill: 0     Follow Up Instructions: I discussed the assessment and treatment plan with the patient. The patient was provided an opportunity to ask questions and all were answered. The patient agreed with the plan and demonstrated an understanding of the instructions.  A copy of instructions were sent to the patient via MyChart unless otherwise  noted below.    The patient was advised to call back or seek an in-person evaluation if the symptoms worsen or if the condition fails to improve as anticipated.  Time:  I spent 7 minutes with the patient via telehealth technology discussing the above problems/concerns.    Jessica Schneiders, FNP

## 2023-03-06 ENCOUNTER — Other Ambulatory Visit (HOSPITAL_COMMUNITY): Payer: Self-pay

## 2023-03-07 ENCOUNTER — Other Ambulatory Visit (HOSPITAL_COMMUNITY): Payer: Self-pay

## 2023-03-08 ENCOUNTER — Other Ambulatory Visit (HOSPITAL_COMMUNITY): Payer: Self-pay

## 2023-03-08 MED ORDER — BIMATOPROST 0.03 % OP SOLN
OPHTHALMIC | 0 refills | Status: DC
  Filled 2023-03-08: qty 2.5, 30d supply, fill #0

## 2023-03-14 ENCOUNTER — Other Ambulatory Visit (HOSPITAL_COMMUNITY): Payer: Self-pay

## 2023-03-15 ENCOUNTER — Other Ambulatory Visit: Payer: Self-pay

## 2023-03-15 ENCOUNTER — Other Ambulatory Visit (HOSPITAL_COMMUNITY): Payer: Self-pay

## 2023-03-15 MED ORDER — LANSOPRAZOLE 30 MG PO CPDR
30.0000 mg | DELAYED_RELEASE_CAPSULE | Freq: Two times a day (BID) | ORAL | 0 refills | Status: DC
  Filled 2023-03-15: qty 180, 90d supply, fill #0

## 2023-03-15 MED ORDER — TOPIRAMATE 25 MG PO TABS
25.0000 mg | ORAL_TABLET | Freq: Every day | ORAL | 0 refills | Status: AC | PRN
  Filled 2023-03-15: qty 90, 90d supply, fill #0

## 2023-03-16 ENCOUNTER — Other Ambulatory Visit (HOSPITAL_COMMUNITY): Payer: Self-pay

## 2023-03-22 ENCOUNTER — Other Ambulatory Visit (HOSPITAL_COMMUNITY): Payer: Self-pay

## 2023-03-22 MED ORDER — NITROFURANTOIN MONOHYD MACRO 100 MG PO CAPS
100.0000 mg | ORAL_CAPSULE | Freq: Two times a day (BID) | ORAL | 0 refills | Status: AC
  Filled 2023-03-22: qty 10, 5d supply, fill #0

## 2023-03-23 ENCOUNTER — Other Ambulatory Visit: Payer: Self-pay

## 2023-03-23 ENCOUNTER — Other Ambulatory Visit (HOSPITAL_COMMUNITY): Payer: Self-pay

## 2023-03-25 ENCOUNTER — Other Ambulatory Visit (HOSPITAL_COMMUNITY): Payer: Self-pay

## 2023-05-02 DIAGNOSIS — D2262 Melanocytic nevi of left upper limb, including shoulder: Secondary | ICD-10-CM | POA: Diagnosis not present

## 2023-05-02 DIAGNOSIS — C4441 Basal cell carcinoma of skin of scalp and neck: Secondary | ICD-10-CM | POA: Diagnosis not present

## 2023-05-02 DIAGNOSIS — Z85828 Personal history of other malignant neoplasm of skin: Secondary | ICD-10-CM | POA: Diagnosis not present

## 2023-05-02 DIAGNOSIS — L814 Other melanin hyperpigmentation: Secondary | ICD-10-CM | POA: Diagnosis not present

## 2023-05-02 DIAGNOSIS — L821 Other seborrheic keratosis: Secondary | ICD-10-CM | POA: Diagnosis not present

## 2023-05-02 DIAGNOSIS — Q825 Congenital non-neoplastic nevus: Secondary | ICD-10-CM | POA: Diagnosis not present

## 2023-05-02 DIAGNOSIS — D225 Melanocytic nevi of trunk: Secondary | ICD-10-CM | POA: Diagnosis not present

## 2023-05-02 DIAGNOSIS — L570 Actinic keratosis: Secondary | ICD-10-CM | POA: Diagnosis not present

## 2023-05-11 ENCOUNTER — Other Ambulatory Visit (HOSPITAL_COMMUNITY): Payer: Self-pay

## 2023-05-12 ENCOUNTER — Other Ambulatory Visit (HOSPITAL_COMMUNITY): Payer: Self-pay

## 2023-05-12 MED ORDER — BIMATOPROST 0.03 % OP SOLN
1.0000 [drp] | Freq: Every evening | OPHTHALMIC | 0 refills | Status: DC
  Filled 2023-05-12: qty 2.5, 30d supply, fill #0

## 2023-05-13 ENCOUNTER — Other Ambulatory Visit: Payer: Self-pay

## 2023-06-02 ENCOUNTER — Other Ambulatory Visit: Payer: Self-pay | Admitting: Oncology

## 2023-06-02 DIAGNOSIS — Z006 Encounter for examination for normal comparison and control in clinical research program: Secondary | ICD-10-CM

## 2023-06-06 ENCOUNTER — Other Ambulatory Visit (HOSPITAL_COMMUNITY): Payer: Self-pay

## 2023-06-06 ENCOUNTER — Other Ambulatory Visit: Payer: Self-pay

## 2023-06-06 ENCOUNTER — Encounter (HOSPITAL_COMMUNITY): Payer: Self-pay

## 2023-06-07 ENCOUNTER — Other Ambulatory Visit (HOSPITAL_COMMUNITY): Payer: Self-pay

## 2023-06-07 MED ORDER — LEVOTHYROXINE SODIUM 50 MCG PO TABS
50.0000 ug | ORAL_TABLET | Freq: Every morning | ORAL | 3 refills | Status: DC
  Filled 2023-06-07: qty 90, 90d supply, fill #0
  Filled 2023-08-30: qty 90, 90d supply, fill #1
  Filled 2023-11-29: qty 90, 90d supply, fill #2
  Filled 2024-03-06: qty 90, 90d supply, fill #3

## 2023-06-07 MED ORDER — LANSOPRAZOLE 30 MG PO CPDR
30.0000 mg | DELAYED_RELEASE_CAPSULE | Freq: Two times a day (BID) | ORAL | 0 refills | Status: DC
  Filled 2023-06-07: qty 180, 90d supply, fill #0

## 2023-06-08 ENCOUNTER — Other Ambulatory Visit (HOSPITAL_COMMUNITY): Payer: Self-pay

## 2023-06-09 ENCOUNTER — Other Ambulatory Visit (HOSPITAL_COMMUNITY): Payer: Self-pay

## 2023-06-09 DIAGNOSIS — Z85828 Personal history of other malignant neoplasm of skin: Secondary | ICD-10-CM | POA: Diagnosis not present

## 2023-06-09 DIAGNOSIS — C4441 Basal cell carcinoma of skin of scalp and neck: Secondary | ICD-10-CM | POA: Diagnosis not present

## 2023-07-18 ENCOUNTER — Other Ambulatory Visit (HOSPITAL_COMMUNITY): Payer: Self-pay

## 2023-07-27 ENCOUNTER — Other Ambulatory Visit (HOSPITAL_COMMUNITY): Payer: Self-pay

## 2023-07-27 MED ORDER — BIMATOPROST 0.03 % OP SOLN
1.0000 [drp] | Freq: Every evening | OPHTHALMIC | 0 refills | Status: DC
  Filled 2023-07-27: qty 2.5, 30d supply, fill #0

## 2023-07-28 ENCOUNTER — Other Ambulatory Visit (HOSPITAL_COMMUNITY): Payer: Self-pay

## 2023-07-29 ENCOUNTER — Other Ambulatory Visit: Payer: Self-pay

## 2023-07-29 ENCOUNTER — Other Ambulatory Visit (HOSPITAL_COMMUNITY): Payer: Self-pay

## 2023-07-29 MED ORDER — TRETINOIN 0.05 % EX CREA
TOPICAL_CREAM | Freq: Every day | CUTANEOUS | 3 refills | Status: AC
  Filled 2023-07-29: qty 60, 90d supply, fill #0

## 2023-07-29 MED ORDER — ACYCLOVIR 5 % EX CREA
TOPICAL_CREAM | CUTANEOUS | 3 refills | Status: AC
  Filled 2023-07-29: qty 5, 5d supply, fill #0

## 2023-07-29 MED ORDER — LORAZEPAM 0.5 MG PO TABS
0.5000 mg | ORAL_TABLET | Freq: Every day | ORAL | 0 refills | Status: AC | PRN
  Filled 2023-07-29: qty 30, 30d supply, fill #0

## 2023-08-01 ENCOUNTER — Encounter (HOSPITAL_COMMUNITY): Payer: Self-pay

## 2023-08-02 ENCOUNTER — Other Ambulatory Visit (HOSPITAL_COMMUNITY): Payer: Self-pay

## 2023-08-13 ENCOUNTER — Other Ambulatory Visit (HOSPITAL_COMMUNITY): Payer: Self-pay

## 2023-08-29 ENCOUNTER — Other Ambulatory Visit (HOSPITAL_COMMUNITY): Payer: Self-pay

## 2023-08-30 ENCOUNTER — Other Ambulatory Visit (HOSPITAL_COMMUNITY): Payer: Self-pay

## 2023-08-31 ENCOUNTER — Other Ambulatory Visit: Payer: Self-pay

## 2023-09-02 ENCOUNTER — Other Ambulatory Visit (HOSPITAL_COMMUNITY): Payer: Self-pay

## 2023-09-02 MED ORDER — LANSOPRAZOLE 30 MG PO CPDR
30.0000 mg | DELAYED_RELEASE_CAPSULE | Freq: Two times a day (BID) | ORAL | 0 refills | Status: DC
  Filled 2023-09-02: qty 180, 90d supply, fill #0

## 2023-09-02 MED ORDER — BIMATOPROST 0.03 % OP SOLN
1.0000 [drp] | OPHTHALMIC | 0 refills | Status: DC
  Filled 2023-09-02: qty 2.5, 30d supply, fill #0

## 2023-10-26 ENCOUNTER — Other Ambulatory Visit (HOSPITAL_COMMUNITY): Payer: Self-pay

## 2023-10-27 ENCOUNTER — Other Ambulatory Visit: Payer: Self-pay

## 2023-10-27 ENCOUNTER — Other Ambulatory Visit (HOSPITAL_COMMUNITY): Payer: Self-pay

## 2023-10-27 MED ORDER — BIMATOPROST 0.03 % OP SOLN
1.0000 [drp] | OPHTHALMIC | 0 refills | Status: DC
  Filled 2023-10-27: qty 2.5, 30d supply, fill #0

## 2023-11-29 ENCOUNTER — Other Ambulatory Visit (HOSPITAL_COMMUNITY): Payer: Self-pay

## 2023-11-29 ENCOUNTER — Other Ambulatory Visit: Payer: Self-pay

## 2023-11-30 ENCOUNTER — Other Ambulatory Visit: Payer: Self-pay

## 2023-11-30 ENCOUNTER — Other Ambulatory Visit (HOSPITAL_COMMUNITY): Payer: Self-pay

## 2023-11-30 MED ORDER — LANSOPRAZOLE 30 MG PO CPDR
30.0000 mg | DELAYED_RELEASE_CAPSULE | Freq: Two times a day (BID) | ORAL | 3 refills | Status: AC
  Filled 2023-11-30: qty 180, 90d supply, fill #0
  Filled 2024-03-06: qty 180, 90d supply, fill #1
  Filled 2024-06-24: qty 180, 90d supply, fill #2
  Filled 2024-09-19: qty 180, 90d supply, fill #3

## 2023-11-30 MED ORDER — BIMATOPROST 0.03 % OP SOLN
1.0000 [drp] | Freq: Every evening | OPHTHALMIC | 0 refills | Status: DC
  Filled 2023-11-30: qty 2.5, 30d supply, fill #0

## 2023-12-19 ENCOUNTER — Other Ambulatory Visit (HOSPITAL_BASED_OUTPATIENT_CLINIC_OR_DEPARTMENT_OTHER): Payer: Self-pay

## 2023-12-20 ENCOUNTER — Other Ambulatory Visit: Payer: Self-pay

## 2023-12-20 ENCOUNTER — Other Ambulatory Visit (HOSPITAL_COMMUNITY): Payer: Self-pay

## 2023-12-22 DIAGNOSIS — R7989 Other specified abnormal findings of blood chemistry: Secondary | ICD-10-CM | POA: Diagnosis not present

## 2023-12-22 DIAGNOSIS — E039 Hypothyroidism, unspecified: Secondary | ICD-10-CM | POA: Diagnosis not present

## 2023-12-29 DIAGNOSIS — B001 Herpesviral vesicular dermatitis: Secondary | ICD-10-CM | POA: Diagnosis not present

## 2023-12-29 DIAGNOSIS — R600 Localized edema: Secondary | ICD-10-CM | POA: Diagnosis not present

## 2023-12-29 DIAGNOSIS — I7300 Raynaud's syndrome without gangrene: Secondary | ICD-10-CM | POA: Diagnosis not present

## 2023-12-29 DIAGNOSIS — D72819 Decreased white blood cell count, unspecified: Secondary | ICD-10-CM | POA: Diagnosis not present

## 2023-12-29 DIAGNOSIS — Z9882 Breast implant status: Secondary | ICD-10-CM | POA: Diagnosis not present

## 2023-12-29 DIAGNOSIS — E039 Hypothyroidism, unspecified: Secondary | ICD-10-CM | POA: Diagnosis not present

## 2023-12-29 DIAGNOSIS — R82998 Other abnormal findings in urine: Secondary | ICD-10-CM | POA: Diagnosis not present

## 2023-12-29 DIAGNOSIS — G4700 Insomnia, unspecified: Secondary | ICD-10-CM | POA: Diagnosis not present

## 2023-12-29 DIAGNOSIS — N6314 Unspecified lump in the right breast, lower inner quadrant: Secondary | ICD-10-CM | POA: Diagnosis not present

## 2023-12-29 DIAGNOSIS — N390 Urinary tract infection, site not specified: Secondary | ICD-10-CM | POA: Diagnosis not present

## 2023-12-29 DIAGNOSIS — Z0000 Encounter for general adult medical examination without abnormal findings: Secondary | ICD-10-CM | POA: Diagnosis not present

## 2024-01-02 ENCOUNTER — Other Ambulatory Visit: Payer: Self-pay | Admitting: Internal Medicine

## 2024-01-02 DIAGNOSIS — N6314 Unspecified lump in the right breast, lower inner quadrant: Secondary | ICD-10-CM

## 2024-01-27 ENCOUNTER — Other Ambulatory Visit: Payer: Self-pay | Admitting: Internal Medicine

## 2024-01-27 DIAGNOSIS — N6314 Unspecified lump in the right breast, lower inner quadrant: Secondary | ICD-10-CM

## 2024-01-27 DIAGNOSIS — N644 Mastodynia: Secondary | ICD-10-CM

## 2024-02-02 ENCOUNTER — Ambulatory Visit: Payer: 59

## 2024-02-02 ENCOUNTER — Ambulatory Visit
Admission: RE | Admit: 2024-02-02 | Discharge: 2024-02-02 | Disposition: A | Discharge status: Home / Self Care | Payer: 59 | Source: Ambulatory Visit | Attending: Internal Medicine | Admitting: Internal Medicine

## 2024-02-02 DIAGNOSIS — N6314 Unspecified lump in the right breast, lower inner quadrant: Secondary | ICD-10-CM

## 2024-02-02 DIAGNOSIS — N644 Mastodynia: Secondary | ICD-10-CM

## 2024-02-21 ENCOUNTER — Encounter: Payer: Self-pay | Admitting: Nurse Practitioner

## 2024-02-21 ENCOUNTER — Ambulatory Visit (INDEPENDENT_AMBULATORY_CARE_PROVIDER_SITE_OTHER): Payer: 59 | Admitting: Nurse Practitioner

## 2024-02-21 ENCOUNTER — Other Ambulatory Visit (HOSPITAL_COMMUNITY): Payer: Self-pay

## 2024-02-21 ENCOUNTER — Other Ambulatory Visit: Payer: Self-pay

## 2024-02-21 ENCOUNTER — Other Ambulatory Visit (HOSPITAL_COMMUNITY)
Admission: RE | Admit: 2024-02-21 | Discharge: 2024-02-21 | Disposition: A | Discharge status: Home / Self Care | Source: Ambulatory Visit | Attending: Nurse Practitioner | Admitting: Nurse Practitioner

## 2024-02-21 VITALS — BP 110/76 | HR 55 | Resp 16 | Ht 61.5 in | Wt 136.0 lb

## 2024-02-21 DIAGNOSIS — Z124 Encounter for screening for malignant neoplasm of cervix: Secondary | ICD-10-CM | POA: Diagnosis not present

## 2024-02-21 DIAGNOSIS — N952 Postmenopausal atrophic vaginitis: Secondary | ICD-10-CM

## 2024-02-21 DIAGNOSIS — Z01419 Encounter for gynecological examination (general) (routine) without abnormal findings: Secondary | ICD-10-CM | POA: Diagnosis not present

## 2024-02-21 DIAGNOSIS — Z780 Asymptomatic menopausal state: Secondary | ICD-10-CM

## 2024-02-21 DIAGNOSIS — B009 Herpesviral infection, unspecified: Secondary | ICD-10-CM

## 2024-02-21 MED ORDER — ESTRADIOL 0.1 MG/GM VA CREA
1.0000 g | TOPICAL_CREAM | VAGINAL | 3 refills | Status: AC
  Filled 2024-02-21: qty 42.5, 90d supply, fill #0
  Filled 2024-06-24: qty 42.5, 90d supply, fill #1

## 2024-02-21 MED ORDER — VALACYCLOVIR HCL 500 MG PO TABS
500.0000 mg | ORAL_TABLET | Freq: Two times a day (BID) | ORAL | 3 refills | Status: DC
  Filled 2024-02-21: qty 90, 45d supply, fill #0
  Filled 2024-05-04: qty 90, 45d supply, fill #1
  Filled 2024-06-24: qty 90, 45d supply, fill #2
  Filled 2024-08-25: qty 90, 45d supply, fill #3

## 2024-02-21 NOTE — Progress Notes (Signed)
 Jessica Hartman 1966-02-07 [id number]   History:  58 y.o. [id number] presents as new patient to establish care. Postmenopausal - no systemic HRT, no bleeding. Using vaginal estrogen for dryness and painful intercourse. Has only been using "as needed" and does not feel it helps much. Abnormal pap years ago, neg biopsies. Hypothyroidism managed by PCP. H/O HSV on buttocks, occasional outbreaks, Valtrex as needed.   Gynecologic History Patient's last menstrual period was 04/21/2015 (exact date).   Contraception/Family planning: post menopausal status Sexually active: Yes  Health Maintenance Last Pap: 11/27/2019. Results were: Normal Last mammogram: 05/26/2022. Results were: Benign right breast mass (decrease in size) Last colonoscopy: 09/18/2015. Results were: Normal Last Dexa: Never  Past medical history, past surgical history, family history and social history were all reviewed and documented in the EPIC chart. 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 3 children, all local. 3 and 45 yo grandchildren.   ROS:  A ROS was performed and pertinent positives and negatives are included.  Exam:  Vitals:   02/21/24 1147  BP: 110/76  Pulse: (!) 55  Resp: 16  Weight: 136 lb (61.7 kg)  Height: 5' 1.5" (1.562 m)   Body mass index is 25.28 kg/m.  General appearance:  Normal Thyroid:  Symmetrical, normal in size, without palpable masses or nodularity. Respiratory  Auscultation:  Clear without wheezing or rhonchi Cardiovascular  Auscultation:  Regular rate, without rubs, murmurs or gallops  Edema/varicosities:  Not grossly evident Abdominal  Soft,nontender, without masses, guarding or rebound.  Liver/spleen:  No organomegaly noted  Hernia:  None appreciated  Skin  Inspection:  Grossly normal Breasts: Examined lying and sitting.   Right: Without masses, retractions, nipple discharge or axillary adenopathy.   Left: Without masses, retractions, nipple discharge or axillary  adenopathy. Pelvic: External genitalia:  no lesions              Urethra:  normal appearing urethra with no masses, tenderness or lesions              Bartholins and Skenes: normal                 Vagina: normal appearing vagina with normal color and discharge, no lesions              Cervix: no lesions Bimanual Exam:  Uterus:  no masses or tenderness              Adnexa: no mass, fullness, tenderness              Rectovaginal: Deferred              Anus:  normal, no lesions  Patient informed chaperone available to be present for breast and pelvic exam. Patient has requested no chaperone to be present. Patient has been advised what will be completed during breast and pelvic exam.   Assessment/Plan:  58 y.o. [id number] to establish care.   Well female exam with routine gynecological exam - Education provided on SBEs, importance of preventative screenings, current guidelines, high calcium diet, regular exercise, and multivitamin daily. Labs with PCP.   Postmenopausal - no systemic HRT, no bleeding  Cervical cancer screening - Plan: Cytology - PAP( [place]). Abnormal pap years ago, neg biopsies.  Vaginal atrophy - Plan: estradiol (ESTRACE VAGINAL) 0.1 MG/GM vaginal cream 2-3 times weekly. Recommend consistent use for best results. [employer] samples provided.   HSV-1 infection - Plan: valACYclovir (VALTREX) 500 MG tablet daily. Reports occasional outbreaks. More than 6 per  year. Will switch to suppressive treatment. Increase to 2 tablets BID for 3-5 days if outbreak occurs.   Screening for breast cancer - Being followed for benign right breast masses (decrease in size). Scheduled in May. Normal breast exam today.  Screening for colon cancer - 2016 colonoscopy. Will repeat at 10-year interval per GI's recommendation.   Screening for osteoporosis - Average risk. Will plan DXA at age 65.   Return in about 1 year (around 02/20/2025) for Annual.    Olivia Mackie DNP, 1:33 PM 02/21/2024

## 2024-02-23 ENCOUNTER — Encounter: Payer: Self-pay | Admitting: Nurse Practitioner

## 2024-02-23 LAB — CYTOLOGY - PAP
Comment: NEGATIVE
Diagnosis: NEGATIVE
High risk HPV: NEGATIVE

## 2024-03-06 ENCOUNTER — Other Ambulatory Visit (HOSPITAL_COMMUNITY): Payer: Self-pay

## 2024-03-06 MED ORDER — BIMATOPROST 0.03 % OP SOLN
1.0000 [drp] | Freq: Every evening | OPHTHALMIC | 0 refills | Status: AC
  Filled 2024-03-06: qty 2.5, 50d supply, fill #0

## 2024-03-07 ENCOUNTER — Other Ambulatory Visit (HOSPITAL_COMMUNITY): Payer: Self-pay

## 2024-03-07 ENCOUNTER — Other Ambulatory Visit: Payer: Self-pay

## 2024-03-07 MED ORDER — LORAZEPAM 0.5 MG PO TABS
0.5000 mg | ORAL_TABLET | Freq: Every day | ORAL | 0 refills | Status: AC | PRN
  Filled 2024-03-07: qty 30, 30d supply, fill #0

## 2024-03-19 ENCOUNTER — Other Ambulatory Visit: Payer: Self-pay | Admitting: Internal Medicine

## 2024-03-19 DIAGNOSIS — N644 Mastodynia: Secondary | ICD-10-CM

## 2024-03-23 ENCOUNTER — Ambulatory Visit
Admission: RE | Admit: 2024-03-23 | Discharge: 2024-03-23 | Disposition: A | Discharge status: Home / Self Care | Source: Ambulatory Visit | Attending: Internal Medicine | Admitting: Internal Medicine

## 2024-03-23 ENCOUNTER — Ambulatory Visit

## 2024-03-23 DIAGNOSIS — N644 Mastodynia: Secondary | ICD-10-CM

## 2024-04-02 ENCOUNTER — Other Ambulatory Visit (HOSPITAL_COMMUNITY): Payer: Self-pay

## 2024-05-01 DIAGNOSIS — Q825 Congenital non-neoplastic nevus: Secondary | ICD-10-CM | POA: Diagnosis not present

## 2024-05-01 DIAGNOSIS — D692 Other nonthrombocytopenic purpura: Secondary | ICD-10-CM | POA: Diagnosis not present

## 2024-05-01 DIAGNOSIS — D485 Neoplasm of uncertain behavior of skin: Secondary | ICD-10-CM | POA: Diagnosis not present

## 2024-05-01 DIAGNOSIS — D225 Melanocytic nevi of trunk: Secondary | ICD-10-CM | POA: Diagnosis not present

## 2024-05-01 DIAGNOSIS — L821 Other seborrheic keratosis: Secondary | ICD-10-CM | POA: Diagnosis not present

## 2024-05-01 DIAGNOSIS — D2262 Melanocytic nevi of left upper limb, including shoulder: Secondary | ICD-10-CM | POA: Diagnosis not present

## 2024-05-01 DIAGNOSIS — Z85828 Personal history of other malignant neoplasm of skin: Secondary | ICD-10-CM | POA: Diagnosis not present

## 2024-05-01 DIAGNOSIS — L570 Actinic keratosis: Secondary | ICD-10-CM | POA: Diagnosis not present

## 2024-05-01 DIAGNOSIS — L814 Other melanin hyperpigmentation: Secondary | ICD-10-CM | POA: Diagnosis not present

## 2024-05-04 ENCOUNTER — Other Ambulatory Visit: Payer: Self-pay

## 2024-06-24 ENCOUNTER — Other Ambulatory Visit (HOSPITAL_COMMUNITY): Payer: Self-pay

## 2024-06-25 ENCOUNTER — Other Ambulatory Visit: Payer: Self-pay

## 2024-06-25 ENCOUNTER — Other Ambulatory Visit (HOSPITAL_COMMUNITY): Payer: Self-pay

## 2024-06-25 MED ORDER — LEVOTHYROXINE SODIUM 50 MCG PO TABS
50.0000 ug | ORAL_TABLET | Freq: Every morning | ORAL | 3 refills | Status: AC
  Filled 2024-06-25: qty 90, 90d supply, fill #0
  Filled 2024-09-19: qty 90, 90d supply, fill #1
  Filled 2024-12-18: qty 90, 90d supply, fill #2

## 2024-07-19 ENCOUNTER — Other Ambulatory Visit: Payer: Self-pay | Admitting: Medical Genetics

## 2024-07-19 DIAGNOSIS — Z006 Encounter for examination for normal comparison and control in clinical research program: Secondary | ICD-10-CM

## 2024-08-25 ENCOUNTER — Other Ambulatory Visit (HOSPITAL_COMMUNITY): Payer: Self-pay

## 2024-08-26 ENCOUNTER — Other Ambulatory Visit (HOSPITAL_COMMUNITY): Payer: Self-pay

## 2024-08-27 ENCOUNTER — Other Ambulatory Visit: Payer: Self-pay

## 2024-08-27 ENCOUNTER — Other Ambulatory Visit (HOSPITAL_COMMUNITY): Payer: Self-pay

## 2024-08-27 MED ORDER — LORAZEPAM 0.5 MG PO TABS
0.5000 mg | ORAL_TABLET | Freq: Every day | ORAL | 0 refills | Status: AC | PRN
  Filled 2024-08-27: qty 30, 30d supply, fill #0

## 2024-08-28 ENCOUNTER — Encounter (HOSPITAL_COMMUNITY): Payer: Self-pay

## 2024-08-28 ENCOUNTER — Other Ambulatory Visit (HOSPITAL_COMMUNITY): Payer: Self-pay

## 2024-09-04 ENCOUNTER — Other Ambulatory Visit: Payer: Self-pay

## 2024-09-04 ENCOUNTER — Other Ambulatory Visit (HOSPITAL_COMMUNITY): Payer: Self-pay

## 2024-09-04 MED ORDER — BIMATOPROST 0.03 % OP SOLN
1.0000 [drp] | Freq: Every evening | OPHTHALMIC | 1 refills | Status: AC
  Filled 2024-09-04: qty 2.5, 50d supply, fill #0
  Filled 2024-11-12: qty 2.5, 50d supply, fill #1

## 2024-09-04 MED ORDER — NITROFURANTOIN MONOHYD MACRO 100 MG PO CAPS
100.0000 mg | ORAL_CAPSULE | ORAL | 0 refills | Status: AC | PRN
  Filled 2024-09-04: qty 20, 20d supply, fill #0

## 2024-09-19 ENCOUNTER — Other Ambulatory Visit: Payer: Self-pay

## 2024-09-19 ENCOUNTER — Other Ambulatory Visit: Payer: Self-pay | Admitting: Medical Genetics

## 2024-09-19 DIAGNOSIS — Z006 Encounter for examination for normal comparison and control in clinical research program: Secondary | ICD-10-CM

## 2024-09-20 ENCOUNTER — Other Ambulatory Visit (HOSPITAL_COMMUNITY): Payer: Self-pay

## 2024-09-21 ENCOUNTER — Other Ambulatory Visit (HOSPITAL_COMMUNITY): Payer: Self-pay

## 2024-09-24 ENCOUNTER — Other Ambulatory Visit (HOSPITAL_COMMUNITY): Payer: Self-pay

## 2024-10-08 ENCOUNTER — Other Ambulatory Visit (HOSPITAL_COMMUNITY): Payer: Self-pay

## 2024-10-08 MED ORDER — ESTRADIOL 0.01 % VA CREA
TOPICAL_CREAM | VAGINAL | 1 refills | Status: AC
  Filled 2024-10-08: qty 42.5, 90d supply, fill #0

## 2024-11-12 ENCOUNTER — Other Ambulatory Visit: Payer: Self-pay | Admitting: Nurse Practitioner

## 2024-11-12 DIAGNOSIS — B009 Herpesviral infection, unspecified: Secondary | ICD-10-CM

## 2024-11-13 ENCOUNTER — Other Ambulatory Visit: Payer: Self-pay

## 2024-11-13 ENCOUNTER — Other Ambulatory Visit (HOSPITAL_BASED_OUTPATIENT_CLINIC_OR_DEPARTMENT_OTHER): Payer: Self-pay

## 2024-11-13 MED ORDER — VALACYCLOVIR HCL 500 MG PO TABS
500.0000 mg | ORAL_TABLET | Freq: Two times a day (BID) | ORAL | 0 refills | Status: AC
  Filled 2024-11-13: qty 90, 45d supply, fill #0

## 2024-11-13 NOTE — Telephone Encounter (Signed)
 Med refill request:   ValACYclovir  (VALTREX ) 500 MG tablet  Start:  02/21/24 Disp:  90 tablets Refills:  0 of 3 remaining  Last AEX:  02/21/24 Next AEX:  02/21/25 Last MMG (if hormonal med):  N/A Refill authorized? Please Advise.

## 2024-12-18 ENCOUNTER — Other Ambulatory Visit (HOSPITAL_COMMUNITY): Payer: Self-pay

## 2024-12-19 ENCOUNTER — Other Ambulatory Visit: Payer: Self-pay

## 2024-12-20 ENCOUNTER — Other Ambulatory Visit: Payer: Self-pay

## 2024-12-27 ENCOUNTER — Encounter: Admitting: Rheumatology

## 2025-01-16 ENCOUNTER — Ambulatory Visit: Admitting: Rheumatology

## 2025-02-05 ENCOUNTER — Ambulatory Visit (HOSPITAL_BASED_OUTPATIENT_CLINIC_OR_DEPARTMENT_OTHER): Admitting: Student

## 2025-02-21 ENCOUNTER — Ambulatory Visit: Admitting: Nurse Practitioner
